# Patient Record
Sex: Female | Born: 1950 | Hispanic: No | Marital: Married
Health system: Southern US, Community
[De-identification: ages and names within clinical notes are randomized; demographics above are authoritative.]

---

## 2010-08-21 ENCOUNTER — Encounter: Admission: RE | Admit: 2010-08-21 | Discharge: 2010-08-21 | Payer: Self-pay | Admitting: Unknown Physician Specialty

## 2012-03-26 ENCOUNTER — Other Ambulatory Visit: Payer: Self-pay | Admitting: Family Medicine

## 2012-03-26 ENCOUNTER — Ambulatory Visit
Admission: RE | Admit: 2012-03-26 | Discharge: 2012-03-26 | Disposition: A | Discharge status: Home / Self Care | Payer: No Typology Code available for payment source | Source: Ambulatory Visit | Attending: Family Medicine | Admitting: Family Medicine

## 2012-03-26 DIAGNOSIS — R05 Cough: Secondary | ICD-10-CM

## 2012-03-26 DIAGNOSIS — R053 Chronic cough: Secondary | ICD-10-CM

## 2013-05-15 IMAGING — CR DG CHEST 2V
2 series · 2 of 2 positions shown · non-contrast
Comparison: None.

CLINICAL DATA: Chronic cough for 9 months, former smoking history

CHEST - 2 VIEW

[view not recorded (1 of 2)]
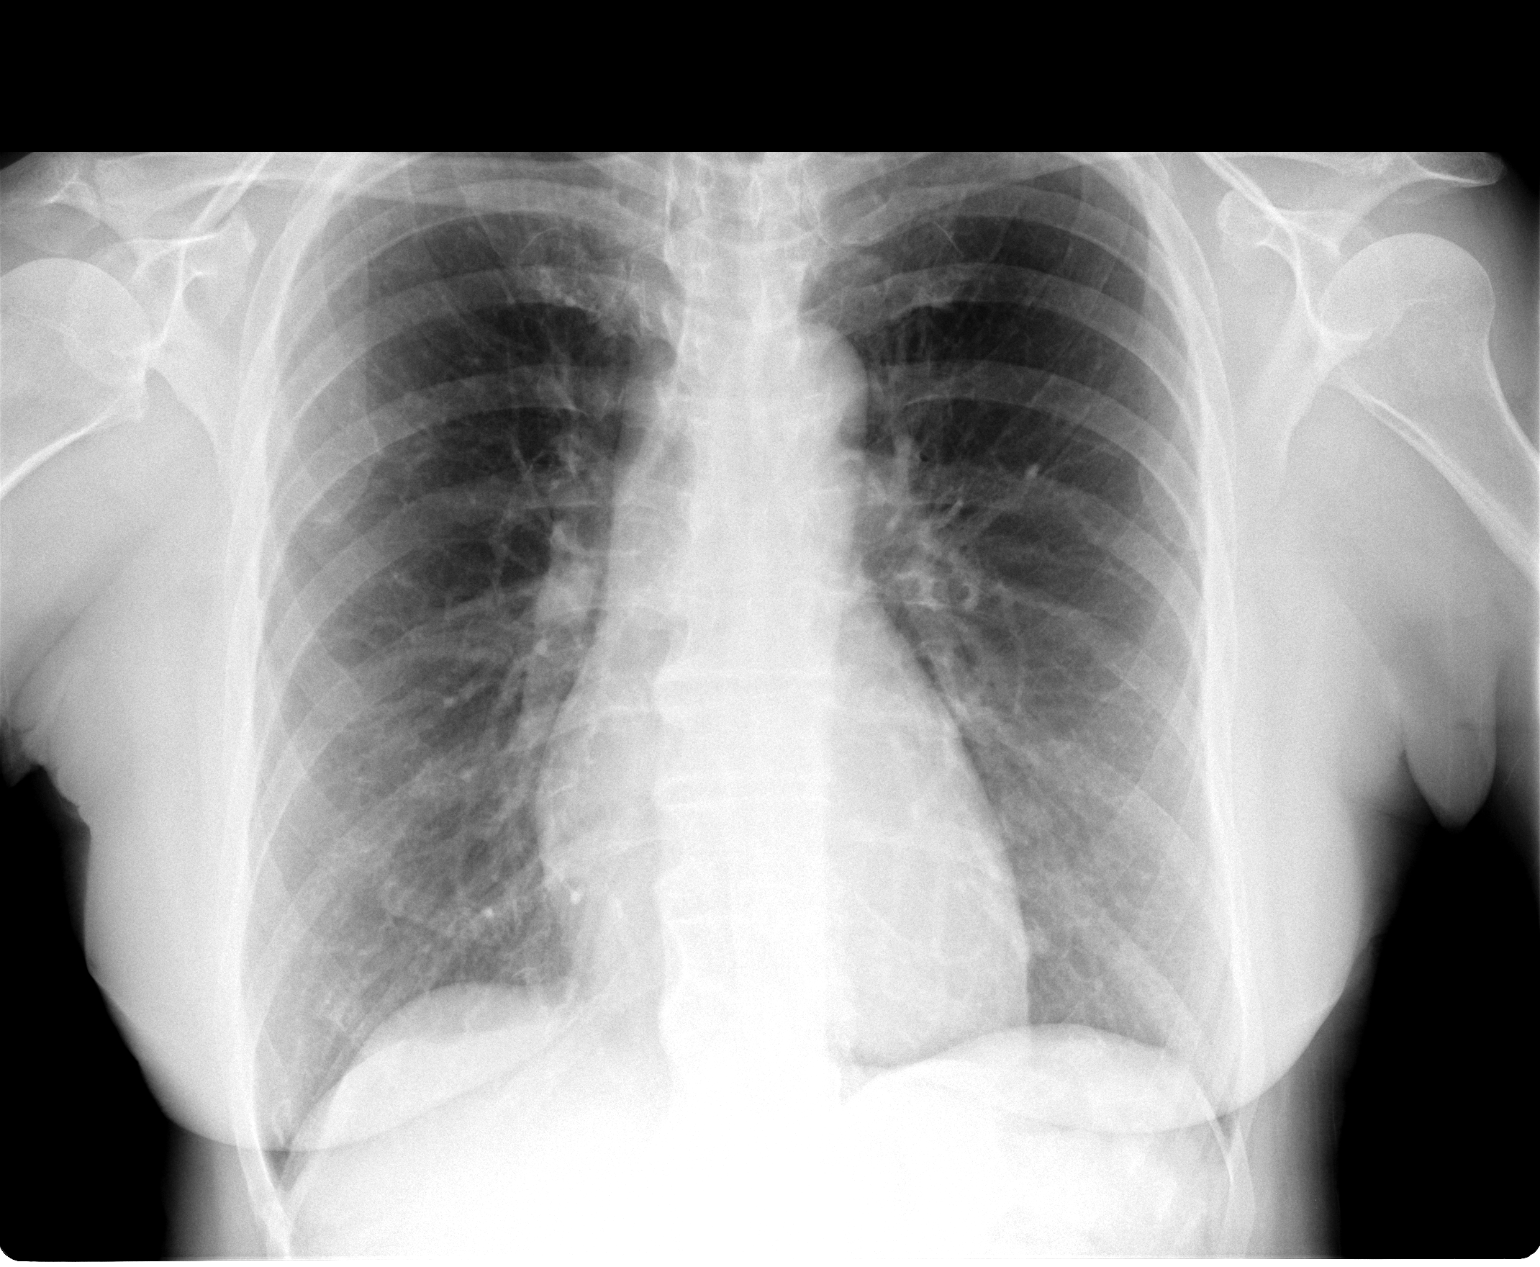

[view not recorded (2 of 2)]
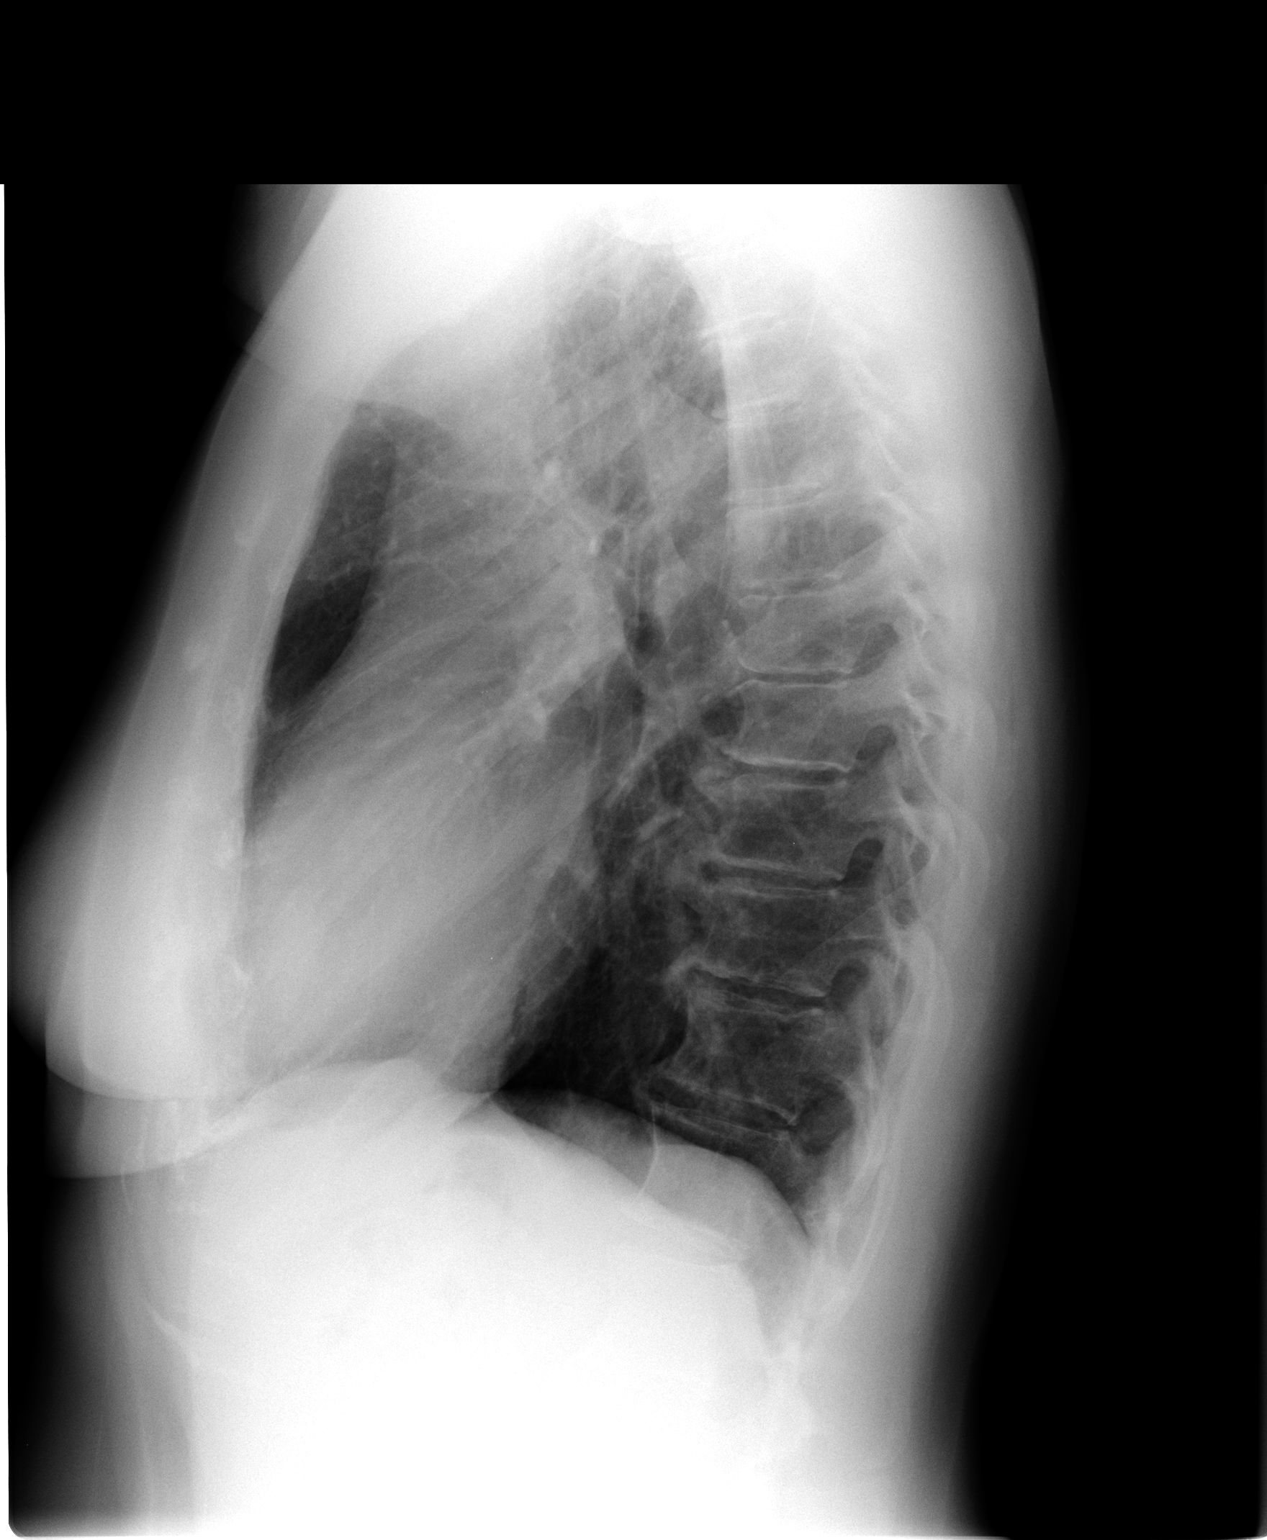

[2 of 2 positions shown; findings below may reference images not displayed]

FINDINGS: The lungs are clear and slightly hyperaerated.
Mediastinal contours appear normal.  The heart is within normal
limits in size.  There are degenerative changes throughout the
thoracic spine.
IMPRESSION: No active lung disease.

## 2013-11-11 DIAGNOSIS — Z23 Encounter for immunization: Secondary | ICD-10-CM | POA: Diagnosis not present

## 2013-12-09 DIAGNOSIS — H4011X0 Primary open-angle glaucoma, stage unspecified: Secondary | ICD-10-CM | POA: Diagnosis not present

## 2013-12-09 DIAGNOSIS — H409 Unspecified glaucoma: Secondary | ICD-10-CM | POA: Diagnosis not present

## 2014-02-02 DIAGNOSIS — Z733 Stress, not elsewhere classified: Secondary | ICD-10-CM | POA: Diagnosis not present

## 2014-02-02 DIAGNOSIS — G4730 Sleep apnea, unspecified: Secondary | ICD-10-CM | POA: Diagnosis not present

## 2014-02-02 DIAGNOSIS — H6120 Impacted cerumen, unspecified ear: Secondary | ICD-10-CM | POA: Diagnosis not present

## 2014-02-02 DIAGNOSIS — E7800 Pure hypercholesterolemia, unspecified: Secondary | ICD-10-CM | POA: Diagnosis not present

## 2014-02-02 DIAGNOSIS — J309 Allergic rhinitis, unspecified: Secondary | ICD-10-CM | POA: Diagnosis not present

## 2014-02-02 DIAGNOSIS — J069 Acute upper respiratory infection, unspecified: Secondary | ICD-10-CM | POA: Diagnosis not present

## 2014-02-02 DIAGNOSIS — N943 Premenstrual tension syndrome: Secondary | ICD-10-CM | POA: Diagnosis not present

## 2014-02-02 DIAGNOSIS — R05 Cough: Secondary | ICD-10-CM | POA: Diagnosis not present

## 2014-02-02 DIAGNOSIS — R059 Cough, unspecified: Secondary | ICD-10-CM | POA: Diagnosis not present

## 2014-02-02 DIAGNOSIS — J9801 Acute bronchospasm: Secondary | ICD-10-CM | POA: Diagnosis not present

## 2014-02-26 DIAGNOSIS — M26629 Arthralgia of temporomandibular joint, unspecified side: Secondary | ICD-10-CM | POA: Diagnosis not present

## 2014-02-26 DIAGNOSIS — R6884 Jaw pain: Secondary | ICD-10-CM | POA: Diagnosis not present

## 2014-02-26 DIAGNOSIS — Z0000 Encounter for general adult medical examination without abnormal findings: Secondary | ICD-10-CM | POA: Insufficient documentation

## 2014-03-10 DIAGNOSIS — H40029 Open angle with borderline findings, high risk, unspecified eye: Secondary | ICD-10-CM | POA: Insufficient documentation

## 2014-03-10 DIAGNOSIS — H2510 Age-related nuclear cataract, unspecified eye: Secondary | ICD-10-CM | POA: Diagnosis not present

## 2014-03-10 DIAGNOSIS — H2513 Age-related nuclear cataract, bilateral: Secondary | ICD-10-CM | POA: Insufficient documentation

## 2014-03-17 DIAGNOSIS — J309 Allergic rhinitis, unspecified: Secondary | ICD-10-CM | POA: Diagnosis not present

## 2014-03-17 DIAGNOSIS — E7800 Pure hypercholesterolemia, unspecified: Secondary | ICD-10-CM | POA: Diagnosis not present

## 2014-03-17 DIAGNOSIS — S0300XA Dislocation of jaw, unspecified side, initial encounter: Secondary | ICD-10-CM | POA: Diagnosis not present

## 2014-03-17 DIAGNOSIS — Z733 Stress, not elsewhere classified: Secondary | ICD-10-CM | POA: Diagnosis not present

## 2014-03-17 DIAGNOSIS — G4730 Sleep apnea, unspecified: Secondary | ICD-10-CM | POA: Diagnosis not present

## 2014-03-17 DIAGNOSIS — H409 Unspecified glaucoma: Secondary | ICD-10-CM | POA: Diagnosis not present

## 2014-03-17 DIAGNOSIS — N943 Premenstrual tension syndrome: Secondary | ICD-10-CM | POA: Diagnosis not present

## 2014-03-17 DIAGNOSIS — J45909 Unspecified asthma, uncomplicated: Secondary | ICD-10-CM | POA: Diagnosis not present

## 2014-04-27 DIAGNOSIS — H2510 Age-related nuclear cataract, unspecified eye: Secondary | ICD-10-CM | POA: Diagnosis not present

## 2014-04-27 DIAGNOSIS — H40029 Open angle with borderline findings, high risk, unspecified eye: Secondary | ICD-10-CM | POA: Diagnosis not present

## 2014-05-13 DIAGNOSIS — H40029 Open angle with borderline findings, high risk, unspecified eye: Secondary | ICD-10-CM | POA: Diagnosis not present

## 2014-06-22 DIAGNOSIS — H2510 Age-related nuclear cataract, unspecified eye: Secondary | ICD-10-CM | POA: Diagnosis not present

## 2014-06-22 DIAGNOSIS — H40029 Open angle with borderline findings, high risk, unspecified eye: Secondary | ICD-10-CM | POA: Diagnosis not present

## 2014-06-22 DIAGNOSIS — H10019 Acute follicular conjunctivitis, unspecified eye: Secondary | ICD-10-CM | POA: Diagnosis not present

## 2014-07-20 DIAGNOSIS — Z23 Encounter for immunization: Secondary | ICD-10-CM | POA: Diagnosis not present

## 2014-07-26 DIAGNOSIS — M7742 Metatarsalgia, left foot: Secondary | ICD-10-CM | POA: Diagnosis not present

## 2014-07-26 DIAGNOSIS — M71571 Other bursitis, not elsewhere classified, right ankle and foot: Secondary | ICD-10-CM | POA: Diagnosis not present

## 2014-07-26 DIAGNOSIS — M7731 Calcaneal spur, right foot: Secondary | ICD-10-CM | POA: Diagnosis not present

## 2014-07-26 DIAGNOSIS — M7752 Other enthesopathy of left foot: Secondary | ICD-10-CM | POA: Diagnosis not present

## 2014-07-26 DIAGNOSIS — M2042 Other hammer toe(s) (acquired), left foot: Secondary | ICD-10-CM | POA: Diagnosis not present

## 2014-07-26 DIAGNOSIS — M2012 Hallux valgus (acquired), left foot: Secondary | ICD-10-CM | POA: Diagnosis not present

## 2014-07-26 DIAGNOSIS — M7661 Achilles tendinitis, right leg: Secondary | ICD-10-CM | POA: Diagnosis not present

## 2014-08-02 DIAGNOSIS — H2513 Age-related nuclear cataract, bilateral: Secondary | ICD-10-CM | POA: Diagnosis not present

## 2014-08-02 DIAGNOSIS — H40023 Open angle with borderline findings, high risk, bilateral: Secondary | ICD-10-CM | POA: Diagnosis not present

## 2014-09-01 DIAGNOSIS — M7731 Calcaneal spur, right foot: Secondary | ICD-10-CM | POA: Diagnosis not present

## 2014-09-01 DIAGNOSIS — M7661 Achilles tendinitis, right leg: Secondary | ICD-10-CM | POA: Diagnosis not present

## 2014-10-25 DIAGNOSIS — J069 Acute upper respiratory infection, unspecified: Secondary | ICD-10-CM | POA: Diagnosis not present

## 2014-10-25 DIAGNOSIS — J9801 Acute bronchospasm: Secondary | ICD-10-CM | POA: Diagnosis not present

## 2014-10-29 DIAGNOSIS — R070 Pain in throat: Secondary | ICD-10-CM | POA: Diagnosis not present

## 2014-11-02 DIAGNOSIS — G4730 Sleep apnea, unspecified: Secondary | ICD-10-CM | POA: Diagnosis not present

## 2014-11-02 DIAGNOSIS — E780 Pure hypercholesterolemia: Secondary | ICD-10-CM | POA: Diagnosis not present

## 2014-11-02 DIAGNOSIS — Z0000 Encounter for general adult medical examination without abnormal findings: Secondary | ICD-10-CM | POA: Diagnosis not present

## 2014-11-02 DIAGNOSIS — N943 Premenstrual tension syndrome: Secondary | ICD-10-CM | POA: Diagnosis not present

## 2014-11-02 DIAGNOSIS — J45909 Unspecified asthma, uncomplicated: Secondary | ICD-10-CM | POA: Diagnosis not present

## 2014-11-02 DIAGNOSIS — Z23 Encounter for immunization: Secondary | ICD-10-CM | POA: Diagnosis not present

## 2014-11-09 DIAGNOSIS — E780 Pure hypercholesterolemia: Secondary | ICD-10-CM | POA: Diagnosis not present

## 2014-11-09 DIAGNOSIS — Z0000 Encounter for general adult medical examination without abnormal findings: Secondary | ICD-10-CM | POA: Diagnosis not present

## 2014-11-10 DIAGNOSIS — G4733 Obstructive sleep apnea (adult) (pediatric): Secondary | ICD-10-CM | POA: Diagnosis not present

## 2014-11-12 DIAGNOSIS — Z79899 Other long term (current) drug therapy: Secondary | ICD-10-CM | POA: Diagnosis not present

## 2014-11-12 DIAGNOSIS — Z1211 Encounter for screening for malignant neoplasm of colon: Secondary | ICD-10-CM | POA: Diagnosis not present

## 2014-11-17 DIAGNOSIS — H4011X1 Primary open-angle glaucoma, mild stage: Secondary | ICD-10-CM | POA: Diagnosis not present

## 2014-12-13 DIAGNOSIS — R0789 Other chest pain: Secondary | ICD-10-CM | POA: Diagnosis not present

## 2014-12-13 DIAGNOSIS — J9801 Acute bronchospasm: Secondary | ICD-10-CM | POA: Diagnosis not present

## 2014-12-13 DIAGNOSIS — J069 Acute upper respiratory infection, unspecified: Secondary | ICD-10-CM | POA: Diagnosis not present

## 2014-12-13 DIAGNOSIS — R0602 Shortness of breath: Secondary | ICD-10-CM | POA: Diagnosis not present

## 2014-12-13 DIAGNOSIS — J45909 Unspecified asthma, uncomplicated: Secondary | ICD-10-CM | POA: Diagnosis not present

## 2014-12-15 DIAGNOSIS — D2339 Other benign neoplasm of skin of other parts of face: Secondary | ICD-10-CM | POA: Diagnosis not present

## 2014-12-15 DIAGNOSIS — D485 Neoplasm of uncertain behavior of skin: Secondary | ICD-10-CM | POA: Diagnosis not present

## 2014-12-15 DIAGNOSIS — L820 Inflamed seborrheic keratosis: Secondary | ICD-10-CM | POA: Diagnosis not present

## 2015-01-04 DIAGNOSIS — G4733 Obstructive sleep apnea (adult) (pediatric): Secondary | ICD-10-CM | POA: Diagnosis not present

## 2015-01-10 DIAGNOSIS — G4733 Obstructive sleep apnea (adult) (pediatric): Secondary | ICD-10-CM | POA: Diagnosis not present

## 2015-02-09 DIAGNOSIS — R52 Pain, unspecified: Secondary | ICD-10-CM | POA: Diagnosis not present

## 2015-02-09 DIAGNOSIS — J309 Allergic rhinitis, unspecified: Secondary | ICD-10-CM | POA: Diagnosis not present

## 2015-02-09 DIAGNOSIS — J029 Acute pharyngitis, unspecified: Secondary | ICD-10-CM | POA: Diagnosis not present

## 2015-02-23 DIAGNOSIS — G4733 Obstructive sleep apnea (adult) (pediatric): Secondary | ICD-10-CM | POA: Diagnosis not present

## 2015-03-03 DIAGNOSIS — G4733 Obstructive sleep apnea (adult) (pediatric): Secondary | ICD-10-CM | POA: Diagnosis not present

## 2015-05-18 DIAGNOSIS — H4011X1 Primary open-angle glaucoma, mild stage: Secondary | ICD-10-CM | POA: Diagnosis not present

## 2015-05-23 DIAGNOSIS — F439 Reaction to severe stress, unspecified: Secondary | ICD-10-CM | POA: Diagnosis not present

## 2015-05-23 DIAGNOSIS — M545 Low back pain: Secondary | ICD-10-CM | POA: Diagnosis not present

## 2015-05-23 DIAGNOSIS — J45909 Unspecified asthma, uncomplicated: Secondary | ICD-10-CM | POA: Diagnosis not present

## 2015-05-23 DIAGNOSIS — E780 Pure hypercholesterolemia: Secondary | ICD-10-CM | POA: Diagnosis not present

## 2015-06-01 DIAGNOSIS — M531 Cervicobrachial syndrome: Secondary | ICD-10-CM | POA: Diagnosis not present

## 2015-06-01 DIAGNOSIS — M9903 Segmental and somatic dysfunction of lumbar region: Secondary | ICD-10-CM | POA: Diagnosis not present

## 2015-06-01 DIAGNOSIS — M9904 Segmental and somatic dysfunction of sacral region: Secondary | ICD-10-CM | POA: Diagnosis not present

## 2015-06-01 DIAGNOSIS — M9901 Segmental and somatic dysfunction of cervical region: Secondary | ICD-10-CM | POA: Diagnosis not present

## 2015-06-02 DIAGNOSIS — M9901 Segmental and somatic dysfunction of cervical region: Secondary | ICD-10-CM | POA: Diagnosis not present

## 2015-06-02 DIAGNOSIS — M531 Cervicobrachial syndrome: Secondary | ICD-10-CM | POA: Diagnosis not present

## 2015-06-02 DIAGNOSIS — M9904 Segmental and somatic dysfunction of sacral region: Secondary | ICD-10-CM | POA: Diagnosis not present

## 2015-06-02 DIAGNOSIS — M9903 Segmental and somatic dysfunction of lumbar region: Secondary | ICD-10-CM | POA: Diagnosis not present

## 2015-06-06 DIAGNOSIS — M9904 Segmental and somatic dysfunction of sacral region: Secondary | ICD-10-CM | POA: Diagnosis not present

## 2015-06-06 DIAGNOSIS — M531 Cervicobrachial syndrome: Secondary | ICD-10-CM | POA: Diagnosis not present

## 2015-06-06 DIAGNOSIS — M9903 Segmental and somatic dysfunction of lumbar region: Secondary | ICD-10-CM | POA: Diagnosis not present

## 2015-06-06 DIAGNOSIS — M9901 Segmental and somatic dysfunction of cervical region: Secondary | ICD-10-CM | POA: Diagnosis not present

## 2015-06-07 DIAGNOSIS — M9901 Segmental and somatic dysfunction of cervical region: Secondary | ICD-10-CM | POA: Diagnosis not present

## 2015-06-07 DIAGNOSIS — M531 Cervicobrachial syndrome: Secondary | ICD-10-CM | POA: Diagnosis not present

## 2015-06-07 DIAGNOSIS — M9903 Segmental and somatic dysfunction of lumbar region: Secondary | ICD-10-CM | POA: Diagnosis not present

## 2015-06-07 DIAGNOSIS — M9904 Segmental and somatic dysfunction of sacral region: Secondary | ICD-10-CM | POA: Diagnosis not present

## 2015-06-08 DIAGNOSIS — M9904 Segmental and somatic dysfunction of sacral region: Secondary | ICD-10-CM | POA: Diagnosis not present

## 2015-06-08 DIAGNOSIS — M531 Cervicobrachial syndrome: Secondary | ICD-10-CM | POA: Diagnosis not present

## 2015-06-08 DIAGNOSIS — M9903 Segmental and somatic dysfunction of lumbar region: Secondary | ICD-10-CM | POA: Diagnosis not present

## 2015-06-08 DIAGNOSIS — M9901 Segmental and somatic dysfunction of cervical region: Secondary | ICD-10-CM | POA: Diagnosis not present

## 2015-06-09 DIAGNOSIS — M9904 Segmental and somatic dysfunction of sacral region: Secondary | ICD-10-CM | POA: Diagnosis not present

## 2015-06-09 DIAGNOSIS — M531 Cervicobrachial syndrome: Secondary | ICD-10-CM | POA: Diagnosis not present

## 2015-06-09 DIAGNOSIS — M9903 Segmental and somatic dysfunction of lumbar region: Secondary | ICD-10-CM | POA: Diagnosis not present

## 2015-06-09 DIAGNOSIS — M9901 Segmental and somatic dysfunction of cervical region: Secondary | ICD-10-CM | POA: Diagnosis not present

## 2015-06-13 DIAGNOSIS — M531 Cervicobrachial syndrome: Secondary | ICD-10-CM | POA: Diagnosis not present

## 2015-06-13 DIAGNOSIS — M9904 Segmental and somatic dysfunction of sacral region: Secondary | ICD-10-CM | POA: Diagnosis not present

## 2015-06-13 DIAGNOSIS — M9903 Segmental and somatic dysfunction of lumbar region: Secondary | ICD-10-CM | POA: Diagnosis not present

## 2015-06-13 DIAGNOSIS — M9901 Segmental and somatic dysfunction of cervical region: Secondary | ICD-10-CM | POA: Diagnosis not present

## 2015-06-14 DIAGNOSIS — M531 Cervicobrachial syndrome: Secondary | ICD-10-CM | POA: Diagnosis not present

## 2015-06-14 DIAGNOSIS — M9903 Segmental and somatic dysfunction of lumbar region: Secondary | ICD-10-CM | POA: Diagnosis not present

## 2015-06-14 DIAGNOSIS — M9901 Segmental and somatic dysfunction of cervical region: Secondary | ICD-10-CM | POA: Diagnosis not present

## 2015-06-14 DIAGNOSIS — M9904 Segmental and somatic dysfunction of sacral region: Secondary | ICD-10-CM | POA: Diagnosis not present

## 2015-06-15 DIAGNOSIS — M531 Cervicobrachial syndrome: Secondary | ICD-10-CM | POA: Diagnosis not present

## 2015-06-15 DIAGNOSIS — M9903 Segmental and somatic dysfunction of lumbar region: Secondary | ICD-10-CM | POA: Diagnosis not present

## 2015-06-15 DIAGNOSIS — M9901 Segmental and somatic dysfunction of cervical region: Secondary | ICD-10-CM | POA: Diagnosis not present

## 2015-06-15 DIAGNOSIS — M9904 Segmental and somatic dysfunction of sacral region: Secondary | ICD-10-CM | POA: Diagnosis not present

## 2015-06-16 DIAGNOSIS — M9901 Segmental and somatic dysfunction of cervical region: Secondary | ICD-10-CM | POA: Diagnosis not present

## 2015-06-16 DIAGNOSIS — M531 Cervicobrachial syndrome: Secondary | ICD-10-CM | POA: Diagnosis not present

## 2015-06-16 DIAGNOSIS — M9903 Segmental and somatic dysfunction of lumbar region: Secondary | ICD-10-CM | POA: Diagnosis not present

## 2015-06-16 DIAGNOSIS — M9904 Segmental and somatic dysfunction of sacral region: Secondary | ICD-10-CM | POA: Diagnosis not present

## 2015-06-21 DIAGNOSIS — M9904 Segmental and somatic dysfunction of sacral region: Secondary | ICD-10-CM | POA: Diagnosis not present

## 2015-06-21 DIAGNOSIS — M531 Cervicobrachial syndrome: Secondary | ICD-10-CM | POA: Diagnosis not present

## 2015-06-21 DIAGNOSIS — M9901 Segmental and somatic dysfunction of cervical region: Secondary | ICD-10-CM | POA: Diagnosis not present

## 2015-06-21 DIAGNOSIS — M9903 Segmental and somatic dysfunction of lumbar region: Secondary | ICD-10-CM | POA: Diagnosis not present

## 2015-06-21 DIAGNOSIS — Z23 Encounter for immunization: Secondary | ICD-10-CM | POA: Diagnosis not present

## 2015-06-22 DIAGNOSIS — M531 Cervicobrachial syndrome: Secondary | ICD-10-CM | POA: Diagnosis not present

## 2015-06-22 DIAGNOSIS — M9904 Segmental and somatic dysfunction of sacral region: Secondary | ICD-10-CM | POA: Diagnosis not present

## 2015-06-22 DIAGNOSIS — M9903 Segmental and somatic dysfunction of lumbar region: Secondary | ICD-10-CM | POA: Diagnosis not present

## 2015-06-22 DIAGNOSIS — M9901 Segmental and somatic dysfunction of cervical region: Secondary | ICD-10-CM | POA: Diagnosis not present

## 2015-06-30 DIAGNOSIS — H01002 Unspecified blepharitis right lower eyelid: Secondary | ICD-10-CM | POA: Diagnosis not present

## 2015-06-30 DIAGNOSIS — H01004 Unspecified blepharitis left upper eyelid: Secondary | ICD-10-CM | POA: Diagnosis not present

## 2015-06-30 DIAGNOSIS — H01005 Unspecified blepharitis left lower eyelid: Secondary | ICD-10-CM | POA: Diagnosis not present

## 2015-06-30 DIAGNOSIS — H01001 Unspecified blepharitis right upper eyelid: Secondary | ICD-10-CM | POA: Diagnosis not present

## 2015-07-13 DIAGNOSIS — M9904 Segmental and somatic dysfunction of sacral region: Secondary | ICD-10-CM | POA: Diagnosis not present

## 2015-07-13 DIAGNOSIS — M9903 Segmental and somatic dysfunction of lumbar region: Secondary | ICD-10-CM | POA: Diagnosis not present

## 2015-07-13 DIAGNOSIS — M9901 Segmental and somatic dysfunction of cervical region: Secondary | ICD-10-CM | POA: Diagnosis not present

## 2015-07-13 DIAGNOSIS — M531 Cervicobrachial syndrome: Secondary | ICD-10-CM | POA: Diagnosis not present

## 2015-07-14 DIAGNOSIS — M531 Cervicobrachial syndrome: Secondary | ICD-10-CM | POA: Diagnosis not present

## 2015-07-14 DIAGNOSIS — M9901 Segmental and somatic dysfunction of cervical region: Secondary | ICD-10-CM | POA: Diagnosis not present

## 2015-07-14 DIAGNOSIS — M9903 Segmental and somatic dysfunction of lumbar region: Secondary | ICD-10-CM | POA: Diagnosis not present

## 2015-07-14 DIAGNOSIS — M9904 Segmental and somatic dysfunction of sacral region: Secondary | ICD-10-CM | POA: Diagnosis not present

## 2015-07-18 DIAGNOSIS — M531 Cervicobrachial syndrome: Secondary | ICD-10-CM | POA: Diagnosis not present

## 2015-07-18 DIAGNOSIS — M9904 Segmental and somatic dysfunction of sacral region: Secondary | ICD-10-CM | POA: Diagnosis not present

## 2015-07-18 DIAGNOSIS — M9901 Segmental and somatic dysfunction of cervical region: Secondary | ICD-10-CM | POA: Diagnosis not present

## 2015-07-18 DIAGNOSIS — M9903 Segmental and somatic dysfunction of lumbar region: Secondary | ICD-10-CM | POA: Diagnosis not present

## 2015-07-19 DIAGNOSIS — M531 Cervicobrachial syndrome: Secondary | ICD-10-CM | POA: Diagnosis not present

## 2015-07-19 DIAGNOSIS — M9901 Segmental and somatic dysfunction of cervical region: Secondary | ICD-10-CM | POA: Diagnosis not present

## 2015-07-19 DIAGNOSIS — M9904 Segmental and somatic dysfunction of sacral region: Secondary | ICD-10-CM | POA: Diagnosis not present

## 2015-07-19 DIAGNOSIS — M9903 Segmental and somatic dysfunction of lumbar region: Secondary | ICD-10-CM | POA: Diagnosis not present

## 2015-07-21 DIAGNOSIS — M9903 Segmental and somatic dysfunction of lumbar region: Secondary | ICD-10-CM | POA: Diagnosis not present

## 2015-07-21 DIAGNOSIS — M531 Cervicobrachial syndrome: Secondary | ICD-10-CM | POA: Diagnosis not present

## 2015-07-21 DIAGNOSIS — M9901 Segmental and somatic dysfunction of cervical region: Secondary | ICD-10-CM | POA: Diagnosis not present

## 2015-07-21 DIAGNOSIS — M9904 Segmental and somatic dysfunction of sacral region: Secondary | ICD-10-CM | POA: Diagnosis not present

## 2015-07-25 DIAGNOSIS — M9904 Segmental and somatic dysfunction of sacral region: Secondary | ICD-10-CM | POA: Diagnosis not present

## 2015-07-25 DIAGNOSIS — M9903 Segmental and somatic dysfunction of lumbar region: Secondary | ICD-10-CM | POA: Diagnosis not present

## 2015-07-25 DIAGNOSIS — M9901 Segmental and somatic dysfunction of cervical region: Secondary | ICD-10-CM | POA: Diagnosis not present

## 2015-07-25 DIAGNOSIS — M531 Cervicobrachial syndrome: Secondary | ICD-10-CM | POA: Diagnosis not present

## 2015-07-27 DIAGNOSIS — M9904 Segmental and somatic dysfunction of sacral region: Secondary | ICD-10-CM | POA: Diagnosis not present

## 2015-07-27 DIAGNOSIS — M9901 Segmental and somatic dysfunction of cervical region: Secondary | ICD-10-CM | POA: Diagnosis not present

## 2015-07-27 DIAGNOSIS — M531 Cervicobrachial syndrome: Secondary | ICD-10-CM | POA: Diagnosis not present

## 2015-07-27 DIAGNOSIS — M9903 Segmental and somatic dysfunction of lumbar region: Secondary | ICD-10-CM | POA: Diagnosis not present

## 2015-08-24 DIAGNOSIS — M5136 Other intervertebral disc degeneration, lumbar region: Secondary | ICD-10-CM | POA: Diagnosis not present

## 2015-08-24 DIAGNOSIS — M5416 Radiculopathy, lumbar region: Secondary | ICD-10-CM | POA: Diagnosis not present

## 2015-08-24 DIAGNOSIS — M25519 Pain in unspecified shoulder: Secondary | ICD-10-CM | POA: Diagnosis not present

## 2015-08-24 DIAGNOSIS — Z79899 Other long term (current) drug therapy: Secondary | ICD-10-CM | POA: Diagnosis not present

## 2015-08-24 DIAGNOSIS — G8929 Other chronic pain: Secondary | ICD-10-CM | POA: Diagnosis not present

## 2015-08-24 DIAGNOSIS — Z79891 Long term (current) use of opiate analgesic: Secondary | ICD-10-CM | POA: Diagnosis not present

## 2015-09-06 DIAGNOSIS — M5136 Other intervertebral disc degeneration, lumbar region: Secondary | ICD-10-CM | POA: Diagnosis not present

## 2015-09-06 DIAGNOSIS — M47817 Spondylosis without myelopathy or radiculopathy, lumbosacral region: Secondary | ICD-10-CM | POA: Diagnosis not present

## 2015-09-06 DIAGNOSIS — M47816 Spondylosis without myelopathy or radiculopathy, lumbar region: Secondary | ICD-10-CM | POA: Diagnosis not present

## 2015-09-06 DIAGNOSIS — M4806 Spinal stenosis, lumbar region: Secondary | ICD-10-CM | POA: Diagnosis not present

## 2015-11-02 DIAGNOSIS — Z79891 Long term (current) use of opiate analgesic: Secondary | ICD-10-CM | POA: Diagnosis not present

## 2015-11-03 DIAGNOSIS — F419 Anxiety disorder, unspecified: Secondary | ICD-10-CM | POA: Diagnosis not present

## 2015-11-03 DIAGNOSIS — M47816 Spondylosis without myelopathy or radiculopathy, lumbar region: Secondary | ICD-10-CM | POA: Diagnosis not present

## 2015-11-03 DIAGNOSIS — M545 Low back pain: Secondary | ICD-10-CM | POA: Diagnosis not present

## 2015-11-03 DIAGNOSIS — M5416 Radiculopathy, lumbar region: Secondary | ICD-10-CM | POA: Diagnosis not present

## 2015-11-03 DIAGNOSIS — G8929 Other chronic pain: Secondary | ICD-10-CM | POA: Diagnosis not present

## 2015-11-03 DIAGNOSIS — Z79891 Long term (current) use of opiate analgesic: Secondary | ICD-10-CM | POA: Diagnosis not present

## 2015-11-08 DIAGNOSIS — H15122 Nodular episcleritis, left eye: Secondary | ICD-10-CM | POA: Diagnosis not present

## 2015-11-21 DIAGNOSIS — H00029 Hordeolum internum unspecified eye, unspecified eyelid: Secondary | ICD-10-CM | POA: Diagnosis not present

## 2015-11-22 DIAGNOSIS — K4090 Unilateral inguinal hernia, without obstruction or gangrene, not specified as recurrent: Secondary | ICD-10-CM | POA: Diagnosis not present

## 2015-11-22 DIAGNOSIS — Z01419 Encounter for gynecological examination (general) (routine) without abnormal findings: Secondary | ICD-10-CM | POA: Diagnosis not present

## 2015-11-22 DIAGNOSIS — F439 Reaction to severe stress, unspecified: Secondary | ICD-10-CM | POA: Diagnosis not present

## 2015-11-22 DIAGNOSIS — Z23 Encounter for immunization: Secondary | ICD-10-CM | POA: Diagnosis not present

## 2015-11-22 DIAGNOSIS — Z1289 Encounter for screening for malignant neoplasm of other sites: Secondary | ICD-10-CM | POA: Diagnosis not present

## 2015-11-22 DIAGNOSIS — J45909 Unspecified asthma, uncomplicated: Secondary | ICD-10-CM | POA: Diagnosis not present

## 2015-11-22 DIAGNOSIS — M545 Low back pain: Secondary | ICD-10-CM | POA: Diagnosis not present

## 2015-11-22 DIAGNOSIS — E7800 Pure hypercholesterolemia, unspecified: Secondary | ICD-10-CM | POA: Diagnosis not present

## 2015-11-22 DIAGNOSIS — Z0001 Encounter for general adult medical examination with abnormal findings: Secondary | ICD-10-CM | POA: Diagnosis not present

## 2015-11-22 DIAGNOSIS — H409 Unspecified glaucoma: Secondary | ICD-10-CM | POA: Diagnosis not present

## 2015-11-22 DIAGNOSIS — I10 Essential (primary) hypertension: Secondary | ICD-10-CM | POA: Diagnosis not present

## 2015-12-01 DIAGNOSIS — H0019 Chalazion unspecified eye, unspecified eyelid: Secondary | ICD-10-CM | POA: Diagnosis not present

## 2015-12-01 DIAGNOSIS — H018 Other specified inflammations of eyelid: Secondary | ICD-10-CM | POA: Diagnosis not present

## 2015-12-02 DIAGNOSIS — Z1211 Encounter for screening for malignant neoplasm of colon: Secondary | ICD-10-CM | POA: Diagnosis not present

## 2015-12-02 DIAGNOSIS — Z79899 Other long term (current) drug therapy: Secondary | ICD-10-CM | POA: Diagnosis not present

## 2015-12-08 DIAGNOSIS — M5416 Radiculopathy, lumbar region: Secondary | ICD-10-CM | POA: Diagnosis not present

## 2015-12-08 DIAGNOSIS — G8929 Other chronic pain: Secondary | ICD-10-CM | POA: Diagnosis not present

## 2015-12-08 DIAGNOSIS — M47816 Spondylosis without myelopathy or radiculopathy, lumbar region: Secondary | ICD-10-CM | POA: Diagnosis not present

## 2015-12-14 DIAGNOSIS — J9801 Acute bronchospasm: Secondary | ICD-10-CM | POA: Diagnosis not present

## 2015-12-14 DIAGNOSIS — J069 Acute upper respiratory infection, unspecified: Secondary | ICD-10-CM | POA: Diagnosis not present

## 2015-12-14 DIAGNOSIS — R0789 Other chest pain: Secondary | ICD-10-CM | POA: Diagnosis not present

## 2015-12-14 DIAGNOSIS — I499 Cardiac arrhythmia, unspecified: Secondary | ICD-10-CM | POA: Diagnosis not present

## 2015-12-16 DIAGNOSIS — Z780 Asymptomatic menopausal state: Secondary | ICD-10-CM | POA: Diagnosis not present

## 2015-12-16 DIAGNOSIS — Z1231 Encounter for screening mammogram for malignant neoplasm of breast: Secondary | ICD-10-CM | POA: Diagnosis not present

## 2016-01-05 DIAGNOSIS — Z79899 Other long term (current) drug therapy: Secondary | ICD-10-CM | POA: Diagnosis not present

## 2016-01-05 DIAGNOSIS — G8929 Other chronic pain: Secondary | ICD-10-CM | POA: Diagnosis not present

## 2016-01-05 DIAGNOSIS — M47816 Spondylosis without myelopathy or radiculopathy, lumbar region: Secondary | ICD-10-CM | POA: Diagnosis not present

## 2016-01-05 DIAGNOSIS — Z1211 Encounter for screening for malignant neoplasm of colon: Secondary | ICD-10-CM | POA: Diagnosis not present

## 2016-02-09 DIAGNOSIS — G8929 Other chronic pain: Secondary | ICD-10-CM | POA: Diagnosis not present

## 2016-02-09 DIAGNOSIS — M47816 Spondylosis without myelopathy or radiculopathy, lumbar region: Secondary | ICD-10-CM | POA: Diagnosis not present

## 2016-02-21 DIAGNOSIS — K219 Gastro-esophageal reflux disease without esophagitis: Secondary | ICD-10-CM | POA: Insufficient documentation

## 2016-02-23 DIAGNOSIS — F39 Unspecified mood [affective] disorder: Secondary | ICD-10-CM | POA: Diagnosis not present

## 2016-02-23 DIAGNOSIS — Z9989 Dependence on other enabling machines and devices: Secondary | ICD-10-CM | POA: Diagnosis not present

## 2016-02-23 DIAGNOSIS — G4733 Obstructive sleep apnea (adult) (pediatric): Secondary | ICD-10-CM | POA: Diagnosis not present

## 2016-02-23 DIAGNOSIS — K219 Gastro-esophageal reflux disease without esophagitis: Secondary | ICD-10-CM | POA: Diagnosis not present

## 2016-05-30 DIAGNOSIS — J45909 Unspecified asthma, uncomplicated: Secondary | ICD-10-CM | POA: Diagnosis not present

## 2016-05-30 DIAGNOSIS — G4730 Sleep apnea, unspecified: Secondary | ICD-10-CM | POA: Diagnosis not present

## 2016-05-30 DIAGNOSIS — E7800 Pure hypercholesterolemia, unspecified: Secondary | ICD-10-CM | POA: Diagnosis not present

## 2016-05-30 DIAGNOSIS — K219 Gastro-esophageal reflux disease without esophagitis: Secondary | ICD-10-CM | POA: Diagnosis not present

## 2016-06-12 ENCOUNTER — Other Ambulatory Visit: Payer: Self-pay

## 2016-06-26 DIAGNOSIS — K5900 Constipation, unspecified: Secondary | ICD-10-CM | POA: Diagnosis not present

## 2016-06-26 DIAGNOSIS — R1310 Dysphagia, unspecified: Secondary | ICD-10-CM | POA: Diagnosis not present

## 2016-06-26 DIAGNOSIS — R140 Abdominal distension (gaseous): Secondary | ICD-10-CM | POA: Diagnosis not present

## 2016-06-26 DIAGNOSIS — K219 Gastro-esophageal reflux disease without esophagitis: Secondary | ICD-10-CM | POA: Diagnosis not present

## 2016-06-28 DIAGNOSIS — K208 Other esophagitis: Secondary | ICD-10-CM | POA: Diagnosis not present

## 2016-06-28 DIAGNOSIS — R1310 Dysphagia, unspecified: Secondary | ICD-10-CM | POA: Diagnosis not present

## 2016-06-28 DIAGNOSIS — K200 Eosinophilic esophagitis: Secondary | ICD-10-CM | POA: Diagnosis not present

## 2016-06-28 DIAGNOSIS — K219 Gastro-esophageal reflux disease without esophagitis: Secondary | ICD-10-CM | POA: Diagnosis not present

## 2016-07-10 DIAGNOSIS — Z23 Encounter for immunization: Secondary | ICD-10-CM | POA: Diagnosis not present

## 2016-08-31 DIAGNOSIS — H0288A Meibomian gland dysfunction right eye, upper and lower eyelids: Secondary | ICD-10-CM | POA: Insufficient documentation

## 2016-08-31 DIAGNOSIS — H16223 Keratoconjunctivitis sicca, not specified as Sjogren's, bilateral: Secondary | ICD-10-CM | POA: Insufficient documentation

## 2016-08-31 DIAGNOSIS — H0289 Other specified disorders of eyelid: Secondary | ICD-10-CM | POA: Diagnosis not present

## 2016-09-12 DIAGNOSIS — T1511XA Foreign body in conjunctival sac, right eye, initial encounter: Secondary | ICD-10-CM | POA: Diagnosis not present

## 2016-09-12 DIAGNOSIS — T1512XA Foreign body in conjunctival sac, left eye, initial encounter: Secondary | ICD-10-CM | POA: Diagnosis not present

## 2016-09-12 DIAGNOSIS — H0289 Other specified disorders of eyelid: Secondary | ICD-10-CM | POA: Diagnosis not present

## 2016-09-12 DIAGNOSIS — H16223 Keratoconjunctivitis sicca, not specified as Sjogren's, bilateral: Secondary | ICD-10-CM | POA: Diagnosis not present

## 2016-10-17 DIAGNOSIS — B078 Other viral warts: Secondary | ICD-10-CM | POA: Diagnosis not present

## 2017-01-01 DIAGNOSIS — Z1231 Encounter for screening mammogram for malignant neoplasm of breast: Secondary | ICD-10-CM | POA: Diagnosis not present

## 2017-01-01 DIAGNOSIS — G4730 Sleep apnea, unspecified: Secondary | ICD-10-CM | POA: Diagnosis not present

## 2017-01-01 DIAGNOSIS — Z0000 Encounter for general adult medical examination without abnormal findings: Secondary | ICD-10-CM | POA: Diagnosis not present

## 2017-01-01 DIAGNOSIS — E7800 Pure hypercholesterolemia, unspecified: Secondary | ICD-10-CM | POA: Diagnosis not present

## 2017-01-01 DIAGNOSIS — K210 Gastro-esophageal reflux disease with esophagitis: Secondary | ICD-10-CM | POA: Diagnosis not present

## 2017-01-01 DIAGNOSIS — M47816 Spondylosis without myelopathy or radiculopathy, lumbar region: Secondary | ICD-10-CM | POA: Diagnosis not present

## 2017-01-17 DIAGNOSIS — H401131 Primary open-angle glaucoma, bilateral, mild stage: Secondary | ICD-10-CM | POA: Diagnosis not present

## 2017-02-25 DIAGNOSIS — Z9989 Dependence on other enabling machines and devices: Secondary | ICD-10-CM | POA: Diagnosis not present

## 2017-02-25 DIAGNOSIS — G4733 Obstructive sleep apnea (adult) (pediatric): Secondary | ICD-10-CM | POA: Diagnosis not present

## 2017-02-27 DIAGNOSIS — M722 Plantar fascial fibromatosis: Secondary | ICD-10-CM | POA: Diagnosis not present

## 2017-03-21 DIAGNOSIS — Z1231 Encounter for screening mammogram for malignant neoplasm of breast: Secondary | ICD-10-CM | POA: Diagnosis not present

## 2017-03-29 DIAGNOSIS — L989 Disorder of the skin and subcutaneous tissue, unspecified: Secondary | ICD-10-CM | POA: Diagnosis not present

## 2017-03-29 DIAGNOSIS — B079 Viral wart, unspecified: Secondary | ICD-10-CM | POA: Diagnosis not present

## 2017-04-23 DIAGNOSIS — B079 Viral wart, unspecified: Secondary | ICD-10-CM | POA: Diagnosis not present

## 2017-04-23 DIAGNOSIS — L989 Disorder of the skin and subcutaneous tissue, unspecified: Secondary | ICD-10-CM | POA: Diagnosis not present

## 2017-05-23 DIAGNOSIS — B079 Viral wart, unspecified: Secondary | ICD-10-CM | POA: Diagnosis not present

## 2017-06-12 DIAGNOSIS — B079 Viral wart, unspecified: Secondary | ICD-10-CM | POA: Diagnosis not present

## 2017-07-09 DIAGNOSIS — M47816 Spondylosis without myelopathy or radiculopathy, lumbar region: Secondary | ICD-10-CM | POA: Diagnosis not present

## 2017-07-09 DIAGNOSIS — E7800 Pure hypercholesterolemia, unspecified: Secondary | ICD-10-CM | POA: Diagnosis not present

## 2017-07-09 DIAGNOSIS — F338 Other recurrent depressive disorders: Secondary | ICD-10-CM | POA: Diagnosis not present

## 2017-07-09 DIAGNOSIS — J4520 Mild intermittent asthma, uncomplicated: Secondary | ICD-10-CM | POA: Diagnosis not present

## 2017-07-09 DIAGNOSIS — I700 Atherosclerosis of aorta: Secondary | ICD-10-CM | POA: Diagnosis not present

## 2017-07-09 DIAGNOSIS — Z23 Encounter for immunization: Secondary | ICD-10-CM | POA: Diagnosis not present

## 2017-07-12 DIAGNOSIS — B079 Viral wart, unspecified: Secondary | ICD-10-CM | POA: Diagnosis not present

## 2017-07-12 DIAGNOSIS — L989 Disorder of the skin and subcutaneous tissue, unspecified: Secondary | ICD-10-CM | POA: Diagnosis not present

## 2017-11-01 DIAGNOSIS — H2513 Age-related nuclear cataract, bilateral: Secondary | ICD-10-CM | POA: Diagnosis not present

## 2017-11-01 DIAGNOSIS — H401131 Primary open-angle glaucoma, bilateral, mild stage: Secondary | ICD-10-CM | POA: Diagnosis not present

## 2017-11-01 DIAGNOSIS — H527 Unspecified disorder of refraction: Secondary | ICD-10-CM | POA: Diagnosis not present

## 2017-11-01 DIAGNOSIS — H04123 Dry eye syndrome of bilateral lacrimal glands: Secondary | ICD-10-CM | POA: Diagnosis not present

## 2017-12-05 DIAGNOSIS — J069 Acute upper respiratory infection, unspecified: Secondary | ICD-10-CM | POA: Diagnosis not present

## 2017-12-05 DIAGNOSIS — Z20828 Contact with and (suspected) exposure to other viral communicable diseases: Secondary | ICD-10-CM | POA: Diagnosis not present

## 2017-12-05 DIAGNOSIS — R509 Fever, unspecified: Secondary | ICD-10-CM | POA: Diagnosis not present

## 2017-12-05 DIAGNOSIS — R51 Headache: Secondary | ICD-10-CM | POA: Diagnosis not present

## 2017-12-05 DIAGNOSIS — J9801 Acute bronchospasm: Secondary | ICD-10-CM | POA: Diagnosis not present

## 2017-12-05 DIAGNOSIS — R52 Pain, unspecified: Secondary | ICD-10-CM | POA: Diagnosis not present

## 2017-12-05 DIAGNOSIS — R6883 Chills (without fever): Secondary | ICD-10-CM | POA: Diagnosis not present

## 2017-12-27 DIAGNOSIS — Z86010 Personal history of colonic polyps: Secondary | ICD-10-CM | POA: Diagnosis not present

## 2017-12-27 DIAGNOSIS — D120 Benign neoplasm of cecum: Secondary | ICD-10-CM | POA: Diagnosis not present

## 2017-12-27 DIAGNOSIS — D125 Benign neoplasm of sigmoid colon: Secondary | ICD-10-CM | POA: Diagnosis not present

## 2018-01-15 DIAGNOSIS — F338 Other recurrent depressive disorders: Secondary | ICD-10-CM | POA: Diagnosis not present

## 2018-01-15 DIAGNOSIS — R5383 Other fatigue: Secondary | ICD-10-CM | POA: Diagnosis not present

## 2018-01-15 DIAGNOSIS — J4520 Mild intermittent asthma, uncomplicated: Secondary | ICD-10-CM | POA: Diagnosis not present

## 2018-01-15 DIAGNOSIS — E7800 Pure hypercholesterolemia, unspecified: Secondary | ICD-10-CM | POA: Diagnosis not present

## 2018-01-15 DIAGNOSIS — M47816 Spondylosis without myelopathy or radiculopathy, lumbar region: Secondary | ICD-10-CM | POA: Diagnosis not present

## 2018-01-15 DIAGNOSIS — Z1231 Encounter for screening mammogram for malignant neoplasm of breast: Secondary | ICD-10-CM | POA: Diagnosis not present

## 2018-01-15 DIAGNOSIS — Z0000 Encounter for general adult medical examination without abnormal findings: Secondary | ICD-10-CM | POA: Diagnosis not present

## 2018-01-15 DIAGNOSIS — R635 Abnormal weight gain: Secondary | ICD-10-CM | POA: Diagnosis not present

## 2018-03-20 DIAGNOSIS — Z1231 Encounter for screening mammogram for malignant neoplasm of breast: Secondary | ICD-10-CM | POA: Diagnosis not present

## 2018-04-08 DIAGNOSIS — N6489 Other specified disorders of breast: Secondary | ICD-10-CM | POA: Diagnosis not present

## 2018-04-08 DIAGNOSIS — R928 Other abnormal and inconclusive findings on diagnostic imaging of breast: Secondary | ICD-10-CM | POA: Diagnosis not present

## 2018-06-23 DIAGNOSIS — Z789 Other specified health status: Secondary | ICD-10-CM | POA: Insufficient documentation

## 2018-06-23 DIAGNOSIS — F32A Depression, unspecified: Secondary | ICD-10-CM | POA: Insufficient documentation

## 2018-06-23 DIAGNOSIS — E7800 Pure hypercholesterolemia, unspecified: Secondary | ICD-10-CM | POA: Insufficient documentation

## 2018-06-24 DIAGNOSIS — M545 Low back pain, unspecified: Secondary | ICD-10-CM | POA: Insufficient documentation

## 2018-06-24 DIAGNOSIS — M25551 Pain in right hip: Secondary | ICD-10-CM | POA: Diagnosis not present

## 2018-06-24 DIAGNOSIS — M1611 Unilateral primary osteoarthritis, right hip: Secondary | ICD-10-CM | POA: Diagnosis not present

## 2018-07-07 DIAGNOSIS — M47896 Other spondylosis, lumbar region: Secondary | ICD-10-CM | POA: Diagnosis not present

## 2018-07-07 DIAGNOSIS — M545 Low back pain: Secondary | ICD-10-CM | POA: Diagnosis not present

## 2018-07-07 DIAGNOSIS — M48061 Spinal stenosis, lumbar region without neurogenic claudication: Secondary | ICD-10-CM | POA: Diagnosis not present

## 2018-07-07 DIAGNOSIS — M2578 Osteophyte, vertebrae: Secondary | ICD-10-CM | POA: Diagnosis not present

## 2018-07-23 DIAGNOSIS — K210 Gastro-esophageal reflux disease with esophagitis: Secondary | ICD-10-CM | POA: Diagnosis not present

## 2018-07-23 DIAGNOSIS — Z23 Encounter for immunization: Secondary | ICD-10-CM | POA: Diagnosis not present

## 2018-07-23 DIAGNOSIS — J4520 Mild intermittent asthma, uncomplicated: Secondary | ICD-10-CM | POA: Diagnosis not present

## 2018-07-23 DIAGNOSIS — F338 Other recurrent depressive disorders: Secondary | ICD-10-CM | POA: Diagnosis not present

## 2018-07-23 DIAGNOSIS — M47816 Spondylosis without myelopathy or radiculopathy, lumbar region: Secondary | ICD-10-CM | POA: Diagnosis not present

## 2018-07-24 DIAGNOSIS — M533 Sacrococcygeal disorders, not elsewhere classified: Secondary | ICD-10-CM | POA: Insufficient documentation

## 2018-07-24 DIAGNOSIS — M25551 Pain in right hip: Secondary | ICD-10-CM | POA: Diagnosis not present

## 2018-07-24 DIAGNOSIS — M545 Low back pain: Secondary | ICD-10-CM | POA: Diagnosis not present

## 2018-07-24 DIAGNOSIS — E7800 Pure hypercholesterolemia, unspecified: Secondary | ICD-10-CM | POA: Diagnosis not present

## 2018-08-04 DIAGNOSIS — M533 Sacrococcygeal disorders, not elsewhere classified: Secondary | ICD-10-CM | POA: Diagnosis not present

## 2018-08-21 DIAGNOSIS — M25551 Pain in right hip: Secondary | ICD-10-CM | POA: Diagnosis not present

## 2018-08-27 DIAGNOSIS — N898 Other specified noninflammatory disorders of vagina: Secondary | ICD-10-CM | POA: Diagnosis not present

## 2018-08-27 DIAGNOSIS — R300 Dysuria: Secondary | ICD-10-CM | POA: Diagnosis not present

## 2018-09-03 DIAGNOSIS — M533 Sacrococcygeal disorders, not elsewhere classified: Secondary | ICD-10-CM | POA: Diagnosis not present

## 2018-09-04 ENCOUNTER — Other Ambulatory Visit: Payer: Self-pay

## 2018-09-30 DIAGNOSIS — R300 Dysuria: Secondary | ICD-10-CM | POA: Diagnosis not present

## 2018-10-27 DIAGNOSIS — R8290 Unspecified abnormal findings in urine: Secondary | ICD-10-CM | POA: Diagnosis not present

## 2018-11-20 DIAGNOSIS — R8290 Unspecified abnormal findings in urine: Secondary | ICD-10-CM | POA: Diagnosis not present

## 2018-11-21 DIAGNOSIS — M545 Low back pain: Secondary | ICD-10-CM | POA: Diagnosis not present

## 2019-01-27 DIAGNOSIS — R5383 Other fatigue: Secondary | ICD-10-CM | POA: Diagnosis not present

## 2019-01-27 DIAGNOSIS — E7800 Pure hypercholesterolemia, unspecified: Secondary | ICD-10-CM | POA: Diagnosis not present

## 2019-04-06 DIAGNOSIS — M25561 Pain in right knee: Secondary | ICD-10-CM | POA: Diagnosis not present

## 2019-04-07 DIAGNOSIS — Z9989 Dependence on other enabling machines and devices: Secondary | ICD-10-CM | POA: Diagnosis not present

## 2019-04-07 DIAGNOSIS — G4733 Obstructive sleep apnea (adult) (pediatric): Secondary | ICD-10-CM | POA: Diagnosis not present

## 2019-04-07 DIAGNOSIS — F39 Unspecified mood [affective] disorder: Secondary | ICD-10-CM | POA: Diagnosis not present

## 2019-04-08 DIAGNOSIS — R350 Frequency of micturition: Secondary | ICD-10-CM | POA: Diagnosis not present

## 2019-04-08 DIAGNOSIS — R3915 Urgency of urination: Secondary | ICD-10-CM | POA: Diagnosis not present

## 2019-04-08 DIAGNOSIS — R300 Dysuria: Secondary | ICD-10-CM | POA: Diagnosis not present

## 2019-05-05 DIAGNOSIS — M25561 Pain in right knee: Secondary | ICD-10-CM | POA: Diagnosis not present

## 2019-05-05 DIAGNOSIS — M1711 Unilateral primary osteoarthritis, right knee: Secondary | ICD-10-CM | POA: Diagnosis not present

## 2019-05-13 DIAGNOSIS — R350 Frequency of micturition: Secondary | ICD-10-CM | POA: Diagnosis not present

## 2019-05-13 DIAGNOSIS — R300 Dysuria: Secondary | ICD-10-CM | POA: Diagnosis not present

## 2019-05-13 DIAGNOSIS — R3915 Urgency of urination: Secondary | ICD-10-CM | POA: Diagnosis not present

## 2019-06-15 DIAGNOSIS — H903 Sensorineural hearing loss, bilateral: Secondary | ICD-10-CM | POA: Diagnosis not present

## 2019-06-15 DIAGNOSIS — H9313 Tinnitus, bilateral: Secondary | ICD-10-CM | POA: Diagnosis not present

## 2019-06-15 DIAGNOSIS — H838X3 Other specified diseases of inner ear, bilateral: Secondary | ICD-10-CM | POA: Diagnosis not present

## 2019-06-19 DIAGNOSIS — M25561 Pain in right knee: Secondary | ICD-10-CM | POA: Diagnosis not present

## 2019-07-24 DIAGNOSIS — E785 Hyperlipidemia, unspecified: Secondary | ICD-10-CM | POA: Diagnosis not present

## 2019-07-24 DIAGNOSIS — F338 Other recurrent depressive disorders: Secondary | ICD-10-CM | POA: Diagnosis not present

## 2019-07-24 DIAGNOSIS — Z23 Encounter for immunization: Secondary | ICD-10-CM | POA: Diagnosis not present

## 2019-07-24 DIAGNOSIS — J4520 Mild intermittent asthma, uncomplicated: Secondary | ICD-10-CM | POA: Diagnosis not present

## 2019-07-24 DIAGNOSIS — E7800 Pure hypercholesterolemia, unspecified: Secondary | ICD-10-CM | POA: Diagnosis not present

## 2019-09-09 ENCOUNTER — Other Ambulatory Visit: Payer: Self-pay

## 2019-10-20 DIAGNOSIS — H1131 Conjunctival hemorrhage, right eye: Secondary | ICD-10-CM | POA: Diagnosis not present

## 2020-01-11 DIAGNOSIS — M25561 Pain in right knee: Secondary | ICD-10-CM | POA: Diagnosis not present

## 2020-01-11 DIAGNOSIS — M1711 Unilateral primary osteoarthritis, right knee: Secondary | ICD-10-CM | POA: Diagnosis not present

## 2020-01-26 DIAGNOSIS — R635 Abnormal weight gain: Secondary | ICD-10-CM | POA: Diagnosis not present

## 2020-01-26 DIAGNOSIS — R5383 Other fatigue: Secondary | ICD-10-CM | POA: Diagnosis not present

## 2020-01-26 DIAGNOSIS — Z01419 Encounter for gynecological examination (general) (routine) without abnormal findings: Secondary | ICD-10-CM | POA: Diagnosis not present

## 2020-01-26 DIAGNOSIS — Z0000 Encounter for general adult medical examination without abnormal findings: Secondary | ICD-10-CM | POA: Diagnosis not present

## 2020-01-26 DIAGNOSIS — J4520 Mild intermittent asthma, uncomplicated: Secondary | ICD-10-CM | POA: Diagnosis not present

## 2020-01-26 DIAGNOSIS — E7800 Pure hypercholesterolemia, unspecified: Secondary | ICD-10-CM | POA: Diagnosis not present

## 2020-03-04 DIAGNOSIS — Z1231 Encounter for screening mammogram for malignant neoplasm of breast: Secondary | ICD-10-CM | POA: Diagnosis not present

## 2020-03-04 DIAGNOSIS — Z13820 Encounter for screening for osteoporosis: Secondary | ICD-10-CM | POA: Diagnosis not present

## 2020-03-04 DIAGNOSIS — Z780 Asymptomatic menopausal state: Secondary | ICD-10-CM | POA: Diagnosis not present

## 2020-04-27 DIAGNOSIS — H401131 Primary open-angle glaucoma, bilateral, mild stage: Secondary | ICD-10-CM | POA: Diagnosis not present

## 2020-04-27 DIAGNOSIS — H04123 Dry eye syndrome of bilateral lacrimal glands: Secondary | ICD-10-CM | POA: Diagnosis not present

## 2020-07-19 DIAGNOSIS — Z23 Encounter for immunization: Secondary | ICD-10-CM | POA: Diagnosis not present

## 2020-07-26 DIAGNOSIS — M2550 Pain in unspecified joint: Secondary | ICD-10-CM | POA: Diagnosis not present

## 2020-07-26 DIAGNOSIS — E7800 Pure hypercholesterolemia, unspecified: Secondary | ICD-10-CM | POA: Diagnosis not present

## 2020-07-29 DIAGNOSIS — Z23 Encounter for immunization: Secondary | ICD-10-CM | POA: Diagnosis not present

## 2020-09-06 DIAGNOSIS — K5900 Constipation, unspecified: Secondary | ICD-10-CM | POA: Diagnosis not present

## 2020-09-06 DIAGNOSIS — R1310 Dysphagia, unspecified: Secondary | ICD-10-CM | POA: Diagnosis not present

## 2020-09-06 DIAGNOSIS — K219 Gastro-esophageal reflux disease without esophagitis: Secondary | ICD-10-CM | POA: Diagnosis not present

## 2020-09-14 DIAGNOSIS — K219 Gastro-esophageal reflux disease without esophagitis: Secondary | ICD-10-CM | POA: Diagnosis not present

## 2020-09-14 DIAGNOSIS — R1310 Dysphagia, unspecified: Secondary | ICD-10-CM | POA: Diagnosis not present

## 2020-09-14 DIAGNOSIS — K449 Diaphragmatic hernia without obstruction or gangrene: Secondary | ICD-10-CM | POA: Diagnosis not present

## 2020-09-20 DIAGNOSIS — H00012 Hordeolum externum right lower eyelid: Secondary | ICD-10-CM | POA: Diagnosis not present

## 2020-09-20 DIAGNOSIS — H00014 Hordeolum externum left upper eyelid: Secondary | ICD-10-CM | POA: Diagnosis not present

## 2020-10-08 DIAGNOSIS — N3000 Acute cystitis without hematuria: Secondary | ICD-10-CM | POA: Diagnosis not present

## 2020-10-09 DIAGNOSIS — R300 Dysuria: Secondary | ICD-10-CM | POA: Diagnosis not present

## 2020-12-20 DIAGNOSIS — R8290 Unspecified abnormal findings in urine: Secondary | ICD-10-CM | POA: Diagnosis not present

## 2020-12-20 DIAGNOSIS — R3915 Urgency of urination: Secondary | ICD-10-CM | POA: Diagnosis not present

## 2020-12-20 DIAGNOSIS — R300 Dysuria: Secondary | ICD-10-CM | POA: Diagnosis not present

## 2020-12-28 DIAGNOSIS — M25562 Pain in left knee: Secondary | ICD-10-CM | POA: Diagnosis not present

## 2020-12-28 DIAGNOSIS — M1712 Unilateral primary osteoarthritis, left knee: Secondary | ICD-10-CM | POA: Diagnosis not present

## 2020-12-29 DIAGNOSIS — Z86010 Personal history of colonic polyps: Secondary | ICD-10-CM | POA: Insufficient documentation

## 2020-12-29 DIAGNOSIS — K219 Gastro-esophageal reflux disease without esophagitis: Secondary | ICD-10-CM | POA: Diagnosis not present

## 2021-01-18 DIAGNOSIS — H401131 Primary open-angle glaucoma, bilateral, mild stage: Secondary | ICD-10-CM | POA: Diagnosis not present

## 2021-01-18 DIAGNOSIS — H25813 Combined forms of age-related cataract, bilateral: Secondary | ICD-10-CM | POA: Diagnosis not present

## 2021-01-18 DIAGNOSIS — H52203 Unspecified astigmatism, bilateral: Secondary | ICD-10-CM | POA: Diagnosis not present

## 2021-01-18 DIAGNOSIS — H04123 Dry eye syndrome of bilateral lacrimal glands: Secondary | ICD-10-CM | POA: Diagnosis not present

## 2021-01-18 DIAGNOSIS — H527 Unspecified disorder of refraction: Secondary | ICD-10-CM | POA: Diagnosis not present

## 2021-01-23 DIAGNOSIS — M25562 Pain in left knee: Secondary | ICD-10-CM | POA: Diagnosis not present

## 2021-01-25 DIAGNOSIS — Z01419 Encounter for gynecological examination (general) (routine) without abnormal findings: Secondary | ICD-10-CM | POA: Diagnosis not present

## 2021-01-25 DIAGNOSIS — R5383 Other fatigue: Secondary | ICD-10-CM | POA: Diagnosis not present

## 2021-01-25 DIAGNOSIS — E7800 Pure hypercholesterolemia, unspecified: Secondary | ICD-10-CM | POA: Diagnosis not present

## 2021-01-25 DIAGNOSIS — R200 Anesthesia of skin: Secondary | ICD-10-CM | POA: Diagnosis not present

## 2021-01-25 DIAGNOSIS — Z0000 Encounter for general adult medical examination without abnormal findings: Secondary | ICD-10-CM | POA: Diagnosis not present

## 2021-01-25 DIAGNOSIS — Z5181 Encounter for therapeutic drug level monitoring: Secondary | ICD-10-CM | POA: Diagnosis not present

## 2021-01-25 DIAGNOSIS — F338 Other recurrent depressive disorders: Secondary | ICD-10-CM | POA: Diagnosis not present

## 2021-01-25 DIAGNOSIS — J4520 Mild intermittent asthma, uncomplicated: Secondary | ICD-10-CM | POA: Diagnosis not present

## 2021-03-09 DIAGNOSIS — H52203 Unspecified astigmatism, bilateral: Secondary | ICD-10-CM | POA: Diagnosis not present

## 2021-03-09 DIAGNOSIS — H25813 Combined forms of age-related cataract, bilateral: Secondary | ICD-10-CM | POA: Diagnosis not present

## 2021-03-21 DIAGNOSIS — Z87891 Personal history of nicotine dependence: Secondary | ICD-10-CM | POA: Diagnosis not present

## 2021-03-21 DIAGNOSIS — H25811 Combined forms of age-related cataract, right eye: Secondary | ICD-10-CM | POA: Diagnosis not present

## 2021-03-21 DIAGNOSIS — Z888 Allergy status to other drugs, medicaments and biological substances status: Secondary | ICD-10-CM | POA: Diagnosis not present

## 2021-03-21 DIAGNOSIS — F419 Anxiety disorder, unspecified: Secondary | ICD-10-CM | POA: Diagnosis not present

## 2021-03-21 DIAGNOSIS — H25812 Combined forms of age-related cataract, left eye: Secondary | ICD-10-CM | POA: Insufficient documentation

## 2021-03-21 DIAGNOSIS — K219 Gastro-esophageal reflux disease without esophagitis: Secondary | ICD-10-CM | POA: Diagnosis not present

## 2021-03-21 DIAGNOSIS — E785 Hyperlipidemia, unspecified: Secondary | ICD-10-CM | POA: Diagnosis not present

## 2021-03-21 DIAGNOSIS — Z79899 Other long term (current) drug therapy: Secondary | ICD-10-CM | POA: Diagnosis not present

## 2021-03-21 DIAGNOSIS — G4733 Obstructive sleep apnea (adult) (pediatric): Secondary | ICD-10-CM | POA: Diagnosis not present

## 2021-03-22 DIAGNOSIS — Z961 Presence of intraocular lens: Secondary | ICD-10-CM | POA: Diagnosis not present

## 2021-03-22 DIAGNOSIS — H25811 Combined forms of age-related cataract, right eye: Secondary | ICD-10-CM | POA: Diagnosis not present

## 2021-03-28 DIAGNOSIS — H25812 Combined forms of age-related cataract, left eye: Secondary | ICD-10-CM | POA: Diagnosis not present

## 2021-03-28 DIAGNOSIS — Z79899 Other long term (current) drug therapy: Secondary | ICD-10-CM | POA: Diagnosis not present

## 2021-03-28 DIAGNOSIS — H25813 Combined forms of age-related cataract, bilateral: Secondary | ICD-10-CM | POA: Diagnosis not present

## 2021-03-28 DIAGNOSIS — H25811 Combined forms of age-related cataract, right eye: Secondary | ICD-10-CM | POA: Diagnosis not present

## 2021-03-28 DIAGNOSIS — M1990 Unspecified osteoarthritis, unspecified site: Secondary | ICD-10-CM | POA: Diagnosis not present

## 2021-03-28 DIAGNOSIS — K219 Gastro-esophageal reflux disease without esophagitis: Secondary | ICD-10-CM | POA: Diagnosis not present

## 2021-03-28 DIAGNOSIS — E785 Hyperlipidemia, unspecified: Secondary | ICD-10-CM | POA: Diagnosis not present

## 2021-03-28 DIAGNOSIS — H52203 Unspecified astigmatism, bilateral: Secondary | ICD-10-CM | POA: Diagnosis not present

## 2021-03-28 DIAGNOSIS — G4733 Obstructive sleep apnea (adult) (pediatric): Secondary | ICD-10-CM | POA: Diagnosis not present

## 2021-03-28 DIAGNOSIS — F419 Anxiety disorder, unspecified: Secondary | ICD-10-CM | POA: Diagnosis not present

## 2021-03-28 DIAGNOSIS — H401130 Primary open-angle glaucoma, bilateral, stage unspecified: Secondary | ICD-10-CM | POA: Diagnosis not present

## 2021-03-28 DIAGNOSIS — Z888 Allergy status to other drugs, medicaments and biological substances status: Secondary | ICD-10-CM | POA: Diagnosis not present

## 2021-03-28 DIAGNOSIS — G4730 Sleep apnea, unspecified: Secondary | ICD-10-CM | POA: Diagnosis not present

## 2021-03-28 DIAGNOSIS — Z87891 Personal history of nicotine dependence: Secondary | ICD-10-CM | POA: Diagnosis not present

## 2021-03-28 DIAGNOSIS — H04123 Dry eye syndrome of bilateral lacrimal glands: Secondary | ICD-10-CM | POA: Diagnosis not present

## 2021-03-29 DIAGNOSIS — Z961 Presence of intraocular lens: Secondary | ICD-10-CM | POA: Diagnosis not present

## 2021-04-19 DIAGNOSIS — Z9989 Dependence on other enabling machines and devices: Secondary | ICD-10-CM | POA: Diagnosis not present

## 2021-04-19 DIAGNOSIS — G4733 Obstructive sleep apnea (adult) (pediatric): Secondary | ICD-10-CM | POA: Diagnosis not present

## 2021-04-19 DIAGNOSIS — F39 Unspecified mood [affective] disorder: Secondary | ICD-10-CM | POA: Diagnosis not present

## 2021-07-19 DIAGNOSIS — E785 Hyperlipidemia, unspecified: Secondary | ICD-10-CM | POA: Diagnosis not present

## 2021-07-19 DIAGNOSIS — K2100 Gastro-esophageal reflux disease with esophagitis, without bleeding: Secondary | ICD-10-CM | POA: Diagnosis not present

## 2021-07-19 DIAGNOSIS — J4520 Mild intermittent asthma, uncomplicated: Secondary | ICD-10-CM | POA: Diagnosis not present

## 2021-07-19 DIAGNOSIS — F338 Other recurrent depressive disorders: Secondary | ICD-10-CM | POA: Diagnosis not present

## 2021-07-20 DIAGNOSIS — R002 Palpitations: Secondary | ICD-10-CM | POA: Diagnosis not present

## 2021-07-20 DIAGNOSIS — E7800 Pure hypercholesterolemia, unspecified: Secondary | ICD-10-CM | POA: Diagnosis not present

## 2021-07-30 DIAGNOSIS — Z23 Encounter for immunization: Secondary | ICD-10-CM | POA: Diagnosis not present

## 2021-08-28 DIAGNOSIS — R300 Dysuria: Secondary | ICD-10-CM | POA: Diagnosis not present

## 2021-08-28 DIAGNOSIS — N390 Urinary tract infection, site not specified: Secondary | ICD-10-CM | POA: Diagnosis not present

## 2021-08-29 DIAGNOSIS — R072 Precordial pain: Secondary | ICD-10-CM | POA: Diagnosis not present

## 2021-08-29 DIAGNOSIS — R931 Abnormal findings on diagnostic imaging of heart and coronary circulation: Secondary | ICD-10-CM | POA: Diagnosis not present

## 2021-08-29 DIAGNOSIS — R0602 Shortness of breath: Secondary | ICD-10-CM | POA: Diagnosis not present

## 2021-08-29 DIAGNOSIS — R002 Palpitations: Secondary | ICD-10-CM | POA: Diagnosis not present

## 2021-08-30 DIAGNOSIS — R002 Palpitations: Secondary | ICD-10-CM | POA: Diagnosis not present

## 2021-09-14 DIAGNOSIS — I080 Rheumatic disorders of both mitral and aortic valves: Secondary | ICD-10-CM | POA: Diagnosis not present

## 2021-09-14 DIAGNOSIS — R002 Palpitations: Secondary | ICD-10-CM | POA: Diagnosis not present

## 2021-09-20 DIAGNOSIS — I2510 Atherosclerotic heart disease of native coronary artery without angina pectoris: Secondary | ICD-10-CM | POA: Diagnosis not present

## 2021-09-20 DIAGNOSIS — R072 Precordial pain: Secondary | ICD-10-CM | POA: Diagnosis not present

## 2021-09-20 DIAGNOSIS — K449 Diaphragmatic hernia without obstruction or gangrene: Secondary | ICD-10-CM | POA: Diagnosis not present

## 2021-09-20 DIAGNOSIS — R0602 Shortness of breath: Secondary | ICD-10-CM | POA: Diagnosis not present

## 2021-09-25 DIAGNOSIS — I480 Paroxysmal atrial fibrillation: Secondary | ICD-10-CM | POA: Insufficient documentation

## 2021-09-25 DIAGNOSIS — R072 Precordial pain: Secondary | ICD-10-CM | POA: Insufficient documentation

## 2021-09-28 DIAGNOSIS — I480 Paroxysmal atrial fibrillation: Secondary | ICD-10-CM | POA: Diagnosis not present

## 2021-09-28 DIAGNOSIS — R931 Abnormal findings on diagnostic imaging of heart and coronary circulation: Secondary | ICD-10-CM | POA: Diagnosis not present

## 2021-09-29 DIAGNOSIS — Z888 Allergy status to other drugs, medicaments and biological substances status: Secondary | ICD-10-CM | POA: Diagnosis not present

## 2021-09-29 DIAGNOSIS — K219 Gastro-esophageal reflux disease without esophagitis: Secondary | ICD-10-CM | POA: Diagnosis not present

## 2021-09-29 DIAGNOSIS — G4733 Obstructive sleep apnea (adult) (pediatric): Secondary | ICD-10-CM | POA: Diagnosis not present

## 2021-09-29 DIAGNOSIS — R072 Precordial pain: Secondary | ICD-10-CM | POA: Diagnosis not present

## 2021-09-29 DIAGNOSIS — R7989 Other specified abnormal findings of blood chemistry: Secondary | ICD-10-CM | POA: Diagnosis not present

## 2021-09-29 DIAGNOSIS — R002 Palpitations: Secondary | ICD-10-CM | POA: Diagnosis not present

## 2021-09-29 DIAGNOSIS — R931 Abnormal findings on diagnostic imaging of heart and coronary circulation: Secondary | ICD-10-CM | POA: Diagnosis not present

## 2021-09-29 DIAGNOSIS — R0602 Shortness of breath: Secondary | ICD-10-CM | POA: Diagnosis not present

## 2021-09-29 DIAGNOSIS — E785 Hyperlipidemia, unspecified: Secondary | ICD-10-CM | POA: Diagnosis not present

## 2021-09-29 DIAGNOSIS — R079 Chest pain, unspecified: Secondary | ICD-10-CM | POA: Diagnosis not present

## 2021-10-19 DIAGNOSIS — Z9989 Dependence on other enabling machines and devices: Secondary | ICD-10-CM | POA: Diagnosis not present

## 2021-10-19 DIAGNOSIS — I480 Paroxysmal atrial fibrillation: Secondary | ICD-10-CM | POA: Diagnosis not present

## 2021-10-19 DIAGNOSIS — G4733 Obstructive sleep apnea (adult) (pediatric): Secondary | ICD-10-CM | POA: Diagnosis not present

## 2021-11-14 DIAGNOSIS — M5442 Lumbago with sciatica, left side: Secondary | ICD-10-CM | POA: Diagnosis not present

## 2021-11-14 DIAGNOSIS — M47816 Spondylosis without myelopathy or radiculopathy, lumbar region: Secondary | ICD-10-CM | POA: Diagnosis not present

## 2021-11-14 DIAGNOSIS — Z981 Arthrodesis status: Secondary | ICD-10-CM | POA: Diagnosis not present

## 2021-11-14 DIAGNOSIS — Z7901 Long term (current) use of anticoagulants: Secondary | ICD-10-CM | POA: Diagnosis not present

## 2021-11-14 DIAGNOSIS — Z888 Allergy status to other drugs, medicaments and biological substances status: Secondary | ICD-10-CM | POA: Diagnosis not present

## 2021-11-14 DIAGNOSIS — M79605 Pain in left leg: Secondary | ICD-10-CM | POA: Diagnosis not present

## 2021-11-14 DIAGNOSIS — E785 Hyperlipidemia, unspecified: Secondary | ICD-10-CM | POA: Diagnosis not present

## 2021-11-14 DIAGNOSIS — Z79899 Other long term (current) drug therapy: Secondary | ICD-10-CM | POA: Diagnosis not present

## 2021-11-17 DIAGNOSIS — M47816 Spondylosis without myelopathy or radiculopathy, lumbar region: Secondary | ICD-10-CM | POA: Diagnosis not present

## 2021-11-17 DIAGNOSIS — M4316 Spondylolisthesis, lumbar region: Secondary | ICD-10-CM | POA: Diagnosis not present

## 2021-11-17 DIAGNOSIS — M5136 Other intervertebral disc degeneration, lumbar region: Secondary | ICD-10-CM | POA: Diagnosis not present

## 2021-11-17 DIAGNOSIS — M47817 Spondylosis without myelopathy or radiculopathy, lumbosacral region: Secondary | ICD-10-CM | POA: Diagnosis not present

## 2021-11-20 DIAGNOSIS — M47816 Spondylosis without myelopathy or radiculopathy, lumbar region: Secondary | ICD-10-CM | POA: Diagnosis not present

## 2021-11-20 DIAGNOSIS — R6889 Other general symptoms and signs: Secondary | ICD-10-CM | POA: Diagnosis not present

## 2021-11-20 DIAGNOSIS — M5442 Lumbago with sciatica, left side: Secondary | ICD-10-CM | POA: Diagnosis not present

## 2021-11-29 DIAGNOSIS — M5442 Lumbago with sciatica, left side: Secondary | ICD-10-CM | POA: Diagnosis not present

## 2021-11-29 DIAGNOSIS — R6889 Other general symptoms and signs: Secondary | ICD-10-CM | POA: Diagnosis not present

## 2021-12-05 DIAGNOSIS — M5442 Lumbago with sciatica, left side: Secondary | ICD-10-CM | POA: Diagnosis not present

## 2021-12-05 DIAGNOSIS — R6889 Other general symptoms and signs: Secondary | ICD-10-CM | POA: Diagnosis not present

## 2021-12-15 DIAGNOSIS — R6889 Other general symptoms and signs: Secondary | ICD-10-CM | POA: Diagnosis not present

## 2021-12-15 DIAGNOSIS — M5442 Lumbago with sciatica, left side: Secondary | ICD-10-CM | POA: Diagnosis not present

## 2021-12-18 DIAGNOSIS — Z7901 Long term (current) use of anticoagulants: Secondary | ICD-10-CM | POA: Insufficient documentation

## 2021-12-18 DIAGNOSIS — Z87898 Personal history of other specified conditions: Secondary | ICD-10-CM | POA: Insufficient documentation

## 2021-12-18 DIAGNOSIS — I2510 Atherosclerotic heart disease of native coronary artery without angina pectoris: Secondary | ICD-10-CM | POA: Insufficient documentation

## 2021-12-19 DIAGNOSIS — I2510 Atherosclerotic heart disease of native coronary artery without angina pectoris: Secondary | ICD-10-CM | POA: Diagnosis not present

## 2021-12-19 DIAGNOSIS — Z7901 Long term (current) use of anticoagulants: Secondary | ICD-10-CM | POA: Diagnosis not present

## 2021-12-19 DIAGNOSIS — Z0000 Encounter for general adult medical examination without abnormal findings: Secondary | ICD-10-CM | POA: Diagnosis not present

## 2021-12-19 DIAGNOSIS — I480 Paroxysmal atrial fibrillation: Secondary | ICD-10-CM | POA: Diagnosis not present

## 2021-12-19 DIAGNOSIS — Z87898 Personal history of other specified conditions: Secondary | ICD-10-CM | POA: Diagnosis not present

## 2021-12-28 DIAGNOSIS — R6889 Other general symptoms and signs: Secondary | ICD-10-CM | POA: Diagnosis not present

## 2021-12-28 DIAGNOSIS — M5442 Lumbago with sciatica, left side: Secondary | ICD-10-CM | POA: Diagnosis not present

## 2022-01-17 DIAGNOSIS — K2100 Gastro-esophageal reflux disease with esophagitis, without bleeding: Secondary | ICD-10-CM | POA: Diagnosis not present

## 2022-01-17 DIAGNOSIS — J4520 Mild intermittent asthma, uncomplicated: Secondary | ICD-10-CM | POA: Diagnosis not present

## 2022-01-17 DIAGNOSIS — R5383 Other fatigue: Secondary | ICD-10-CM | POA: Diagnosis not present

## 2022-01-17 DIAGNOSIS — Z01419 Encounter for gynecological examination (general) (routine) without abnormal findings: Secondary | ICD-10-CM | POA: Diagnosis not present

## 2022-01-17 DIAGNOSIS — E7800 Pure hypercholesterolemia, unspecified: Secondary | ICD-10-CM | POA: Diagnosis not present

## 2022-01-17 DIAGNOSIS — F338 Other recurrent depressive disorders: Secondary | ICD-10-CM | POA: Diagnosis not present

## 2022-01-17 DIAGNOSIS — Z0000 Encounter for general adult medical examination without abnormal findings: Secondary | ICD-10-CM | POA: Diagnosis not present

## 2022-01-22 DIAGNOSIS — M25561 Pain in right knee: Secondary | ICD-10-CM | POA: Diagnosis not present

## 2022-01-22 DIAGNOSIS — M1711 Unilateral primary osteoarthritis, right knee: Secondary | ICD-10-CM | POA: Diagnosis not present

## 2022-01-25 ENCOUNTER — Encounter: Payer: Self-pay | Admitting: Podiatry

## 2022-01-25 ENCOUNTER — Ambulatory Visit (INDEPENDENT_AMBULATORY_CARE_PROVIDER_SITE_OTHER): Payer: Medicare Other | Admitting: Podiatry

## 2022-01-25 ENCOUNTER — Ambulatory Visit (INDEPENDENT_AMBULATORY_CARE_PROVIDER_SITE_OTHER): Payer: Medicare Other

## 2022-01-25 ENCOUNTER — Other Ambulatory Visit: Payer: Self-pay | Admitting: *Deleted

## 2022-01-25 ENCOUNTER — Encounter: Payer: Self-pay | Admitting: *Deleted

## 2022-01-25 DIAGNOSIS — M7660 Achilles tendinitis, unspecified leg: Secondary | ICD-10-CM

## 2022-01-25 DIAGNOSIS — M79672 Pain in left foot: Secondary | ICD-10-CM | POA: Diagnosis not present

## 2022-01-25 DIAGNOSIS — M7662 Achilles tendinitis, left leg: Secondary | ICD-10-CM | POA: Diagnosis not present

## 2022-01-25 DIAGNOSIS — G8929 Other chronic pain: Secondary | ICD-10-CM

## 2022-01-25 MED ORDER — METHYLPREDNISOLONE 4 MG PO TBPK: ORAL_TABLET | ORAL | 0 refills | Status: AC

## 2022-01-25 NOTE — Patient Instructions (Signed)
Achilles Tendinitis  ?with Rehab ?Achilles tendinitis is a disorder of the Achilles tendon. The Achilles tendon connects the large calf muscles (Gastrocnemius and Soleus) to the heel bone (calcaneus). This tendon is sometimes called the heel cord. It is important for pushing-off and standing on your toes and is important for walking, running, or jumping. Tendinitis is often caused by overuse and repetitive microtrauma. ?SYMPTOMS ?Pain, tenderness, swelling, warmth, and redness may occur over the Achilles tendon even at rest. ?Pain with pushing off, or flexing or extending the ankle. ?Pain that is worsened after or during activity. ?CAUSES  ?Overuse sometimes seen with rapid increase in exercise programs or in sports requiring running and jumping. ?Poor physical conditioning (strength and flexibility or endurance). ?Running sports, especially training running down hills. ?Inadequate warm-up before practice or play or failure to stretch before participation. ?Injury to the tendon. ?PREVENTION  ?Warm up and stretch before practice or competition. ?Allow time for adequate rest and recovery between practices and competition. ?Keep up conditioning. ?Keep up ankle and leg flexibility. ?Improve or keep muscle strength and endurance. ?Improve cardiovascular fitness. ?Use proper technique. ?Use proper equipment (shoes, skates). ?To help prevent recurrence, taping, protective strapping, or an adhesive bandage may be recommended for several weeks after healing is complete. ?PROGNOSIS  ?Recovery may take weeks to several months to heal. ?Longer recovery is expected if symptoms have been prolonged. ?Recovery is usually quicker if the inflammation is due to a direct blow as compared with overuse or sudden strain. ?RELATED COMPLICATIONS  ?Healing time will be prolonged if the condition is not correctly treated. The injury must be given plenty of time to heal. ?Symptoms can reoccur if activity is resumed too soon. ?Untreated,  tendinitis may increase the risk of tendon rupture requiring additional time for recovery and possibly surgery. ?TREATMENT  ?The first treatment consists of rest anti-inflammatory medication, and ice to relieve the pain. ?Stretching and strengthening exercises after resolution of pain will likely help reduce the risk of recurrence. Referral to a physical therapist or athletic trainer for further evaluation and treatment may be helpful. ?A walking boot or cast may be recommended to rest the Achilles tendon. This can help break the cycle of inflammation and microtrauma. ?Arch supports (orthotics) may be prescribed or recommended by your caregiver as an adjunct to therapy and rest. ?Surgery to remove the inflamed tendon lining or degenerated tendon tissue is rarely necessary and has shown less than predictable results. ?MEDICATION  ?Nonsteroidal anti-inflammatory medications, such as aspirin and ibuprofen, may be used for pain and inflammation relief. Do not take within 7 days before surgery. Take these as directed by your caregiver. Contact your caregiver immediately if any bleeding, stomach upset, or signs of allergic reaction occur. Other minor pain relievers, such as acetaminophen, may also be used. ?Pain relievers may be prescribed as necessary by your caregiver. Do not take prescription pain medication for longer than 4 to 7 days. Use only as directed and only as much as you need. ?Cortisone injections are rarely indicated. Cortisone injections may weaken tendons and predispose to rupture. It is better to give the condition more time to heal than to use them. ?HEAT AND COLD ?Cold is used to relieve pain and reduce inflammation for acute and chronic Achilles tendinitis. Cold should be applied for 10 to 15 minutes every 2 to 3 hours for inflammation and pain and immediately after any activity that aggravates your symptoms. Use ice packs or an ice massage. ?Heat may be used before performing stretching  and  strengthening activities prescribed by your caregiver. Use a heat pack or a warm soak. ?SEEK MEDICAL CARE IF: ?Symptoms get worse or do not improve in 2 weeks despite treatment. ?New, unexplained symptoms develop. Drugs used in treatment may produce side effects. ? ?EXERCISES: ? ?RANGE OF MOTION (ROM) AND STRETCHING EXERCISES - Achilles Tendinitis  ?These exercises may help you when beginning to rehabilitate your injury. Your symptoms may resolve with or without further involvement from your physician, physical therapist or athletic trainer. While completing these exercises, remember:  ?Restoring tissue flexibility helps normal motion to return to the joints. This allows healthier, less painful movement and activity. ?An effective stretch should be held for at least 30 seconds. ?A stretch should never be painful. You should only feel a gentle lengthening or release in the stretched tissue. ? ?STRETCH  Gastroc, Standing  ?Place hands on wall. ?Extend right / left leg, keeping the front knee somewhat bent. ?Slightly point your toes inward on your back foot. ?Keeping your right / left heel on the floor and your knee straight, shift your weight toward the wall, not allowing your back to arch. ?You should feel a gentle stretch in the right / left calf. Hold this position for 10 seconds. ?Repeat 3 times. Complete this stretch 2 times per day. ? ?STRETCH  Soleus, Standing  ?Place hands on wall. ?Extend right / left leg, keeping the other knee somewhat bent. ?Slightly point your toes inward on your back foot. ?Keep your right / left heel on the floor, bend your back knee, and slightly shift your weight over the back leg so that you feel a gentle stretch deep in your back calf. ?Hold this position for 10 seconds. ?Repeat 3 times. Complete this stretch 2 times per day. ? ?[employer], Standing  ?Note: This exercise can place a lot of stress on your foot and ankle. Please complete this exercise only if specifically  instructed by your caregiver.  ?Place the ball of your right / left foot on a step, keeping your other foot firmly on the same step. ?Hold on to the wall or a rail for balance. ?Slowly lift your other foot, allowing your body weight to press your heel down over the edge of the step. ?You should feel a stretch in your right / left calf. ?Hold this position for 10 seconds. ?Repeat this exercise with a slight bend in your knee. ?Repeat 3 times. Complete this stretch 2 times per day.  ? ?STRENGTHENING EXERCISES - Achilles Tendinitis ?These exercises may help you when beginning to rehabilitate your injury. They may resolve your symptoms with or without further involvement from your physician, physical therapist or athletic trainer. While completing these exercises, remember:  ?Muscles can gain both the endurance and the strength needed for everyday activities through controlled exercises. ?Complete these exercises as instructed by your physician, physical therapist or athletic trainer. Progress the resistance and repetitions only as guided. ?You may experience muscle soreness or fatigue, but the pain or discomfort you are trying to eliminate should never worsen during these exercises. If this pain does worsen, stop and make certain you are following the directions exactly. If the pain is still present after adjustments, discontinue the exercise until you can discuss the trouble with your clinician. ? ?STRENGTH - Plantar-flexors  ?Sit with your right / left leg extended. Holding onto both ends of a rubber exercise band/tubing, loop it around the ball of your foot. Keep a slight tension in the band. ?Slowly  push your toes away from you, pointing them downward. ?Hold this position for 10 seconds. Return slowly, controlling the tension in the band/tubing. ?Repeat 3 times. Complete this exercise 2 times per day.  ? ?STRENGTH - Plantar-flexors  ?Stand with your feet shoulder width apart. Steady yourself with a wall or table  using as little support as needed. ?Keeping your weight evenly spread over the width of your feet, rise up on your toes.* ?Hold this position for 10 seconds. ?Repeat 3 times. Complete this exercise 2 times per day.  ?

## 2022-01-25 NOTE — Progress Notes (Signed)
?  Subjective:  ?Patient ID: Linda Patel, female    DOB: 29-May-1951,   MRN: [id number] ? ?No chief complaint on file. ? ? ?71 y.o. female presents for concern of left heel knot  . Denies any other pedal complaints. Denies n/v/f/c.  ? ?No past medical history on file. ? ?Objective:  ?Physical Exam: ?Vascular: DP/PT pulses 2/4 bilateral. CFT <3 seconds. Normal hair growth on digits. No edema.  ?Skin. No lacerations or abrasions bilateral feet.  ?Musculoskeletal: MMT 5/5 bilateral lower extremities in DF, PF, Inversion and Eversion. Deceased ROM in DF of ankle joint. Tender to insertion of achilles tendon. No pain proximally along tendon. No pain to medial calcaneal tubercle. No pain with DF/PF.  ?Neurological: Sensation intact to light touch.  ? ?Assessment:  ? ?1. Achilles tendon pain   ?2. Heel pain, chronic, left   ? ? ? ?Plan:  ?Patient was evaluated and treated and all questions answered. ?X-rays reviewed and discussed with patient. No acute fractures or dislocations noted.  Spurring noted to posterior and inferior calcaneus. 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  ?-Xrays reviewed ?-Discussed Achilles insertional tendonitis and treatment options with patient.  ?-Discussed stretching exercises. ?-Rx Medrol dose pack  provided  ?-Heel lifts provided and discussed proper shoewear.  ?-Discussed if no improvement will consider MRI/PT/EPAT/PRP injections.  ?-Patient to return to office in 6 weeks for re-evaluation.  ? ? ? ? ?Lorenda Peck, DPM  ? ? ?

## 2022-01-25 NOTE — Addendum Note (Signed)
Addended by: Cranford Mon R on: 01/25/2022 09:35 AM ? ? Modules accepted: Orders ? ?

## 2022-01-31 DIAGNOSIS — Z86010 Personal history of colonic polyps: Secondary | ICD-10-CM | POA: Diagnosis not present

## 2022-01-31 DIAGNOSIS — K219 Gastro-esophageal reflux disease without esophagitis: Secondary | ICD-10-CM | POA: Diagnosis not present

## 2022-01-31 DIAGNOSIS — R1310 Dysphagia, unspecified: Secondary | ICD-10-CM | POA: Diagnosis not present

## 2022-02-15 DIAGNOSIS — H401131 Primary open-angle glaucoma, bilateral, mild stage: Secondary | ICD-10-CM | POA: Diagnosis not present

## 2022-02-15 DIAGNOSIS — H43393 Other vitreous opacities, bilateral: Secondary | ICD-10-CM | POA: Diagnosis not present

## 2022-02-15 DIAGNOSIS — H26492 Other secondary cataract, left eye: Secondary | ICD-10-CM | POA: Diagnosis not present

## 2022-02-15 DIAGNOSIS — H02825 Cysts of left lower eyelid: Secondary | ICD-10-CM | POA: Diagnosis not present

## 2022-02-15 DIAGNOSIS — H04123 Dry eye syndrome of bilateral lacrimal glands: Secondary | ICD-10-CM | POA: Diagnosis not present

## 2022-02-15 DIAGNOSIS — Z961 Presence of intraocular lens: Secondary | ICD-10-CM | POA: Diagnosis not present

## 2022-02-28 DIAGNOSIS — I480 Paroxysmal atrial fibrillation: Secondary | ICD-10-CM | POA: Diagnosis not present

## 2022-02-28 DIAGNOSIS — I2510 Atherosclerotic heart disease of native coronary artery without angina pectoris: Secondary | ICD-10-CM | POA: Diagnosis not present

## 2022-03-09 ENCOUNTER — Ambulatory Visit: Payer: Medicare Other | Admitting: Podiatry

## 2022-03-13 DIAGNOSIS — F411 Generalized anxiety disorder: Secondary | ICD-10-CM | POA: Diagnosis not present

## 2022-03-13 DIAGNOSIS — Z5181 Encounter for therapeutic drug level monitoring: Secondary | ICD-10-CM | POA: Diagnosis not present

## 2022-03-13 DIAGNOSIS — T466X5A Adverse effect of antihyperlipidemic and antiarteriosclerotic drugs, initial encounter: Secondary | ICD-10-CM | POA: Diagnosis not present

## 2022-03-13 DIAGNOSIS — F410 Panic disorder [episodic paroxysmal anxiety] without agoraphobia: Secondary | ICD-10-CM | POA: Diagnosis not present

## 2022-03-13 DIAGNOSIS — F39 Unspecified mood [affective] disorder: Secondary | ICD-10-CM | POA: Diagnosis not present

## 2022-03-13 DIAGNOSIS — I480 Paroxysmal atrial fibrillation: Secondary | ICD-10-CM | POA: Diagnosis not present

## 2022-03-13 DIAGNOSIS — M791 Myalgia, unspecified site: Secondary | ICD-10-CM | POA: Diagnosis not present

## 2022-03-13 DIAGNOSIS — E7849 Other hyperlipidemia: Secondary | ICD-10-CM | POA: Diagnosis not present

## 2022-03-14 DIAGNOSIS — M25561 Pain in right knee: Secondary | ICD-10-CM | POA: Diagnosis not present

## 2022-03-14 DIAGNOSIS — M25562 Pain in left knee: Secondary | ICD-10-CM | POA: Diagnosis not present

## 2022-03-19 DIAGNOSIS — M85831 Other specified disorders of bone density and structure, right forearm: Secondary | ICD-10-CM | POA: Diagnosis not present

## 2022-03-19 DIAGNOSIS — Z1231 Encounter for screening mammogram for malignant neoplasm of breast: Secondary | ICD-10-CM | POA: Diagnosis not present

## 2022-03-19 DIAGNOSIS — Z780 Asymptomatic menopausal state: Secondary | ICD-10-CM | POA: Diagnosis not present

## 2022-03-19 DIAGNOSIS — Z13820 Encounter for screening for osteoporosis: Secondary | ICD-10-CM | POA: Diagnosis not present

## 2022-04-12 DIAGNOSIS — G8918 Other acute postprocedural pain: Secondary | ICD-10-CM | POA: Diagnosis not present

## 2022-04-12 DIAGNOSIS — M1712 Unilateral primary osteoarthritis, left knee: Secondary | ICD-10-CM | POA: Diagnosis not present

## 2022-04-13 DIAGNOSIS — H25813 Combined forms of age-related cataract, bilateral: Secondary | ICD-10-CM | POA: Diagnosis not present

## 2022-04-13 DIAGNOSIS — I10 Essential (primary) hypertension: Secondary | ICD-10-CM | POA: Diagnosis not present

## 2022-04-13 DIAGNOSIS — F411 Generalized anxiety disorder: Secondary | ICD-10-CM | POA: Diagnosis not present

## 2022-04-13 DIAGNOSIS — Z96652 Presence of left artificial knee joint: Secondary | ICD-10-CM | POA: Diagnosis not present

## 2022-04-13 DIAGNOSIS — E7849 Other hyperlipidemia: Secondary | ICD-10-CM | POA: Diagnosis not present

## 2022-04-13 DIAGNOSIS — K219 Gastro-esophageal reflux disease without esophagitis: Secondary | ICD-10-CM | POA: Diagnosis not present

## 2022-04-13 DIAGNOSIS — Z7901 Long term (current) use of anticoagulants: Secondary | ICD-10-CM | POA: Diagnosis not present

## 2022-04-13 DIAGNOSIS — G4733 Obstructive sleep apnea (adult) (pediatric): Secondary | ICD-10-CM | POA: Diagnosis not present

## 2022-04-13 DIAGNOSIS — Z4789 Encounter for other orthopedic aftercare: Secondary | ICD-10-CM | POA: Diagnosis not present

## 2022-04-13 DIAGNOSIS — M791 Myalgia, unspecified site: Secondary | ICD-10-CM | POA: Diagnosis not present

## 2022-04-13 DIAGNOSIS — T466X5D Adverse effect of antihyperlipidemic and antiarteriosclerotic drugs, subsequent encounter: Secondary | ICD-10-CM | POA: Diagnosis not present

## 2022-04-13 DIAGNOSIS — Z9989 Dependence on other enabling machines and devices: Secondary | ICD-10-CM | POA: Diagnosis not present

## 2022-04-13 DIAGNOSIS — I480 Paroxysmal atrial fibrillation: Secondary | ICD-10-CM | POA: Diagnosis not present

## 2022-04-13 DIAGNOSIS — I2510 Atherosclerotic heart disease of native coronary artery without angina pectoris: Secondary | ICD-10-CM | POA: Diagnosis not present

## 2022-04-13 DIAGNOSIS — Z86010 Personal history of colonic polyps: Secondary | ICD-10-CM | POA: Diagnosis not present

## 2022-04-13 DIAGNOSIS — F410 Panic disorder [episodic paroxysmal anxiety] without agoraphobia: Secondary | ICD-10-CM | POA: Diagnosis not present

## 2022-04-16 DIAGNOSIS — Z4789 Encounter for other orthopedic aftercare: Secondary | ICD-10-CM | POA: Diagnosis not present

## 2022-04-16 DIAGNOSIS — I480 Paroxysmal atrial fibrillation: Secondary | ICD-10-CM | POA: Diagnosis not present

## 2022-04-16 DIAGNOSIS — T466X5D Adverse effect of antihyperlipidemic and antiarteriosclerotic drugs, subsequent encounter: Secondary | ICD-10-CM | POA: Diagnosis not present

## 2022-04-16 DIAGNOSIS — Z7901 Long term (current) use of anticoagulants: Secondary | ICD-10-CM | POA: Diagnosis not present

## 2022-04-16 DIAGNOSIS — M791 Myalgia, unspecified site: Secondary | ICD-10-CM | POA: Diagnosis not present

## 2022-04-16 DIAGNOSIS — Z96652 Presence of left artificial knee joint: Secondary | ICD-10-CM | POA: Diagnosis not present

## 2022-04-18 DIAGNOSIS — Z7901 Long term (current) use of anticoagulants: Secondary | ICD-10-CM | POA: Diagnosis not present

## 2022-04-18 DIAGNOSIS — I480 Paroxysmal atrial fibrillation: Secondary | ICD-10-CM | POA: Diagnosis not present

## 2022-04-18 DIAGNOSIS — Z96652 Presence of left artificial knee joint: Secondary | ICD-10-CM | POA: Diagnosis not present

## 2022-04-18 DIAGNOSIS — M791 Myalgia, unspecified site: Secondary | ICD-10-CM | POA: Diagnosis not present

## 2022-04-18 DIAGNOSIS — T466X5D Adverse effect of antihyperlipidemic and antiarteriosclerotic drugs, subsequent encounter: Secondary | ICD-10-CM | POA: Diagnosis not present

## 2022-04-18 DIAGNOSIS — Z4789 Encounter for other orthopedic aftercare: Secondary | ICD-10-CM | POA: Diagnosis not present

## 2022-04-24 DIAGNOSIS — Z4789 Encounter for other orthopedic aftercare: Secondary | ICD-10-CM | POA: Diagnosis not present

## 2022-04-24 DIAGNOSIS — I480 Paroxysmal atrial fibrillation: Secondary | ICD-10-CM | POA: Diagnosis not present

## 2022-04-24 DIAGNOSIS — M791 Myalgia, unspecified site: Secondary | ICD-10-CM | POA: Diagnosis not present

## 2022-04-24 DIAGNOSIS — T466X5D Adverse effect of antihyperlipidemic and antiarteriosclerotic drugs, subsequent encounter: Secondary | ICD-10-CM | POA: Diagnosis not present

## 2022-04-24 DIAGNOSIS — Z96652 Presence of left artificial knee joint: Secondary | ICD-10-CM | POA: Diagnosis not present

## 2022-04-24 DIAGNOSIS — Z7901 Long term (current) use of anticoagulants: Secondary | ICD-10-CM | POA: Diagnosis not present

## 2022-04-25 DIAGNOSIS — M25562 Pain in left knee: Secondary | ICD-10-CM | POA: Diagnosis not present

## 2022-04-26 DIAGNOSIS — T466X5D Adverse effect of antihyperlipidemic and antiarteriosclerotic drugs, subsequent encounter: Secondary | ICD-10-CM | POA: Diagnosis not present

## 2022-04-26 DIAGNOSIS — Z96652 Presence of left artificial knee joint: Secondary | ICD-10-CM | POA: Diagnosis not present

## 2022-04-26 DIAGNOSIS — Z7901 Long term (current) use of anticoagulants: Secondary | ICD-10-CM | POA: Diagnosis not present

## 2022-04-26 DIAGNOSIS — I480 Paroxysmal atrial fibrillation: Secondary | ICD-10-CM | POA: Diagnosis not present

## 2022-04-26 DIAGNOSIS — Z4789 Encounter for other orthopedic aftercare: Secondary | ICD-10-CM | POA: Diagnosis not present

## 2022-04-26 DIAGNOSIS — M791 Myalgia, unspecified site: Secondary | ICD-10-CM | POA: Diagnosis not present

## 2022-05-07 DIAGNOSIS — M25562 Pain in left knee: Secondary | ICD-10-CM | POA: Diagnosis not present

## 2022-05-07 DIAGNOSIS — G8929 Other chronic pain: Secondary | ICD-10-CM | POA: Diagnosis not present

## 2022-05-09 DIAGNOSIS — R6889 Other general symptoms and signs: Secondary | ICD-10-CM | POA: Diagnosis not present

## 2022-05-09 DIAGNOSIS — M25562 Pain in left knee: Secondary | ICD-10-CM | POA: Diagnosis not present

## 2022-05-16 DIAGNOSIS — R6889 Other general symptoms and signs: Secondary | ICD-10-CM | POA: Diagnosis not present

## 2022-05-16 DIAGNOSIS — M25562 Pain in left knee: Secondary | ICD-10-CM | POA: Diagnosis not present

## 2022-05-23 DIAGNOSIS — R6889 Other general symptoms and signs: Secondary | ICD-10-CM | POA: Diagnosis not present

## 2022-05-23 DIAGNOSIS — M25562 Pain in left knee: Secondary | ICD-10-CM | POA: Diagnosis not present

## 2022-05-24 DIAGNOSIS — H401133 Primary open-angle glaucoma, bilateral, severe stage: Secondary | ICD-10-CM | POA: Diagnosis not present

## 2022-06-29 DIAGNOSIS — H401130 Primary open-angle glaucoma, bilateral, stage unspecified: Secondary | ICD-10-CM | POA: Diagnosis not present

## 2022-08-02 DIAGNOSIS — Z23 Encounter for immunization: Secondary | ICD-10-CM | POA: Diagnosis not present

## 2022-09-12 DIAGNOSIS — M25561 Pain in right knee: Secondary | ICD-10-CM | POA: Diagnosis not present

## 2022-09-12 DIAGNOSIS — Z96652 Presence of left artificial knee joint: Secondary | ICD-10-CM | POA: Diagnosis not present

## 2022-10-01 DIAGNOSIS — H524 Presbyopia: Secondary | ICD-10-CM | POA: Diagnosis not present

## 2022-10-01 DIAGNOSIS — H26493 Other secondary cataract, bilateral: Secondary | ICD-10-CM | POA: Diagnosis not present

## 2022-10-01 DIAGNOSIS — Z961 Presence of intraocular lens: Secondary | ICD-10-CM | POA: Diagnosis not present

## 2022-10-01 DIAGNOSIS — H401131 Primary open-angle glaucoma, bilateral, mild stage: Secondary | ICD-10-CM | POA: Diagnosis not present

## 2022-10-01 DIAGNOSIS — H35362 Drusen (degenerative) of macula, left eye: Secondary | ICD-10-CM | POA: Diagnosis not present

## 2022-10-01 DIAGNOSIS — H526 Other disorders of refraction: Secondary | ICD-10-CM | POA: Diagnosis not present

## 2023-03-16 IMAGING — DX DG FOOT COMPLETE 3+V*L*
3 series · 3 of 3 positions shown · non-contrast
Comparison: None.

CLINICAL DATA: Left heel pain for 3 days. History of hammertoe
surgery.

EXAM:
LEFT FOOT - COMPLETE 3+ VIEW

[foot ap wb]
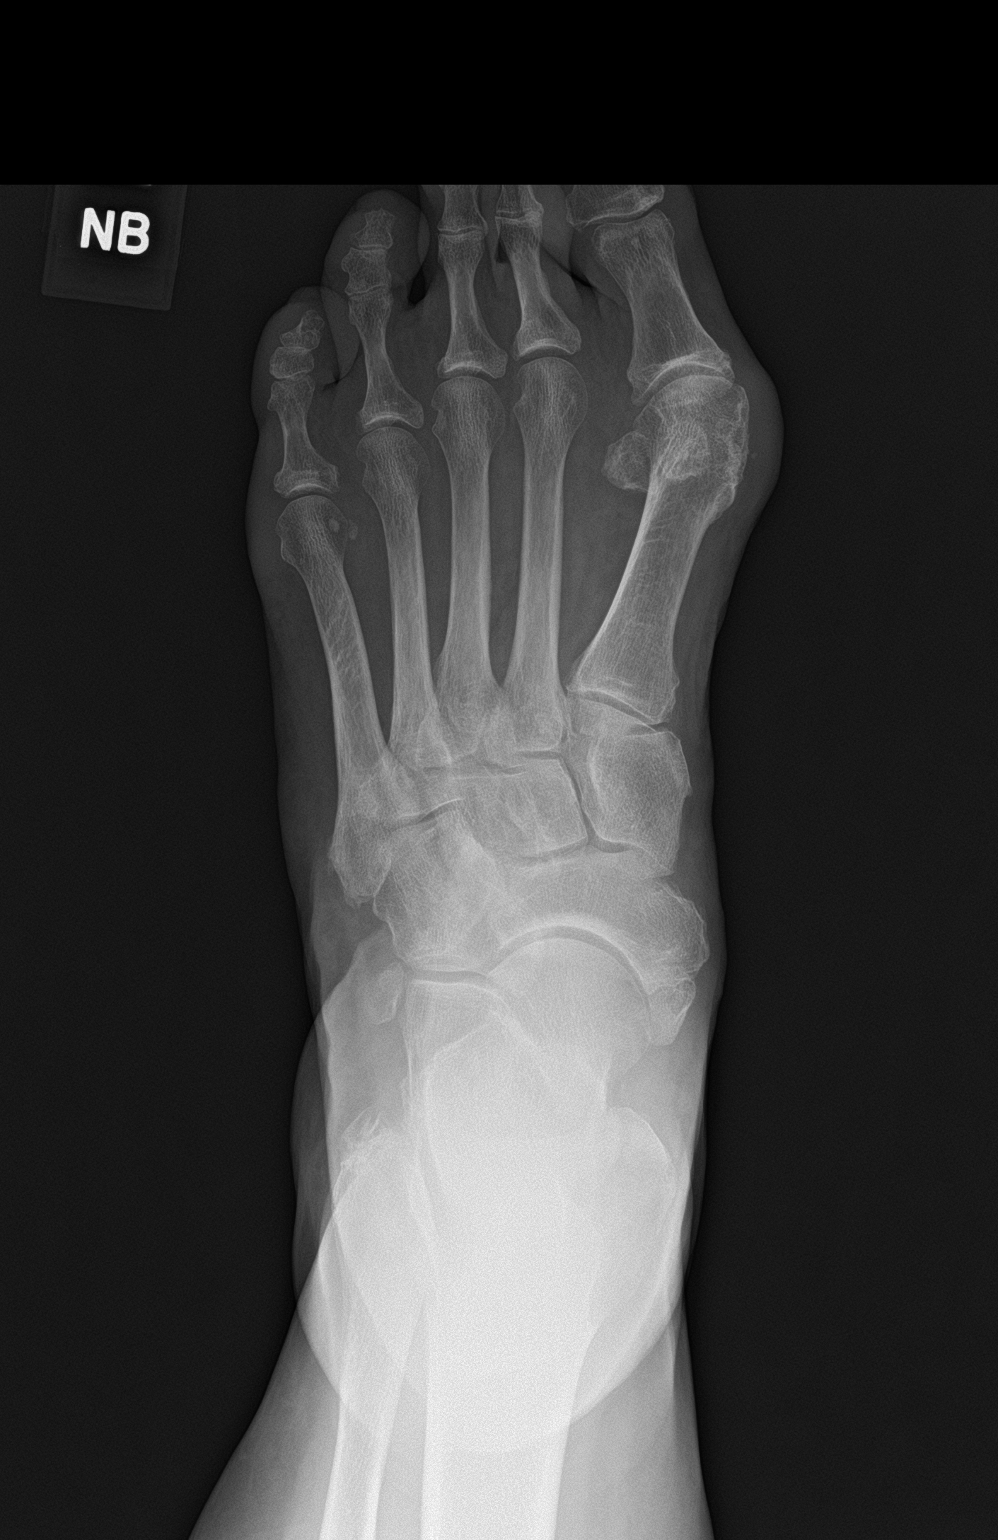

[foot obl wb]
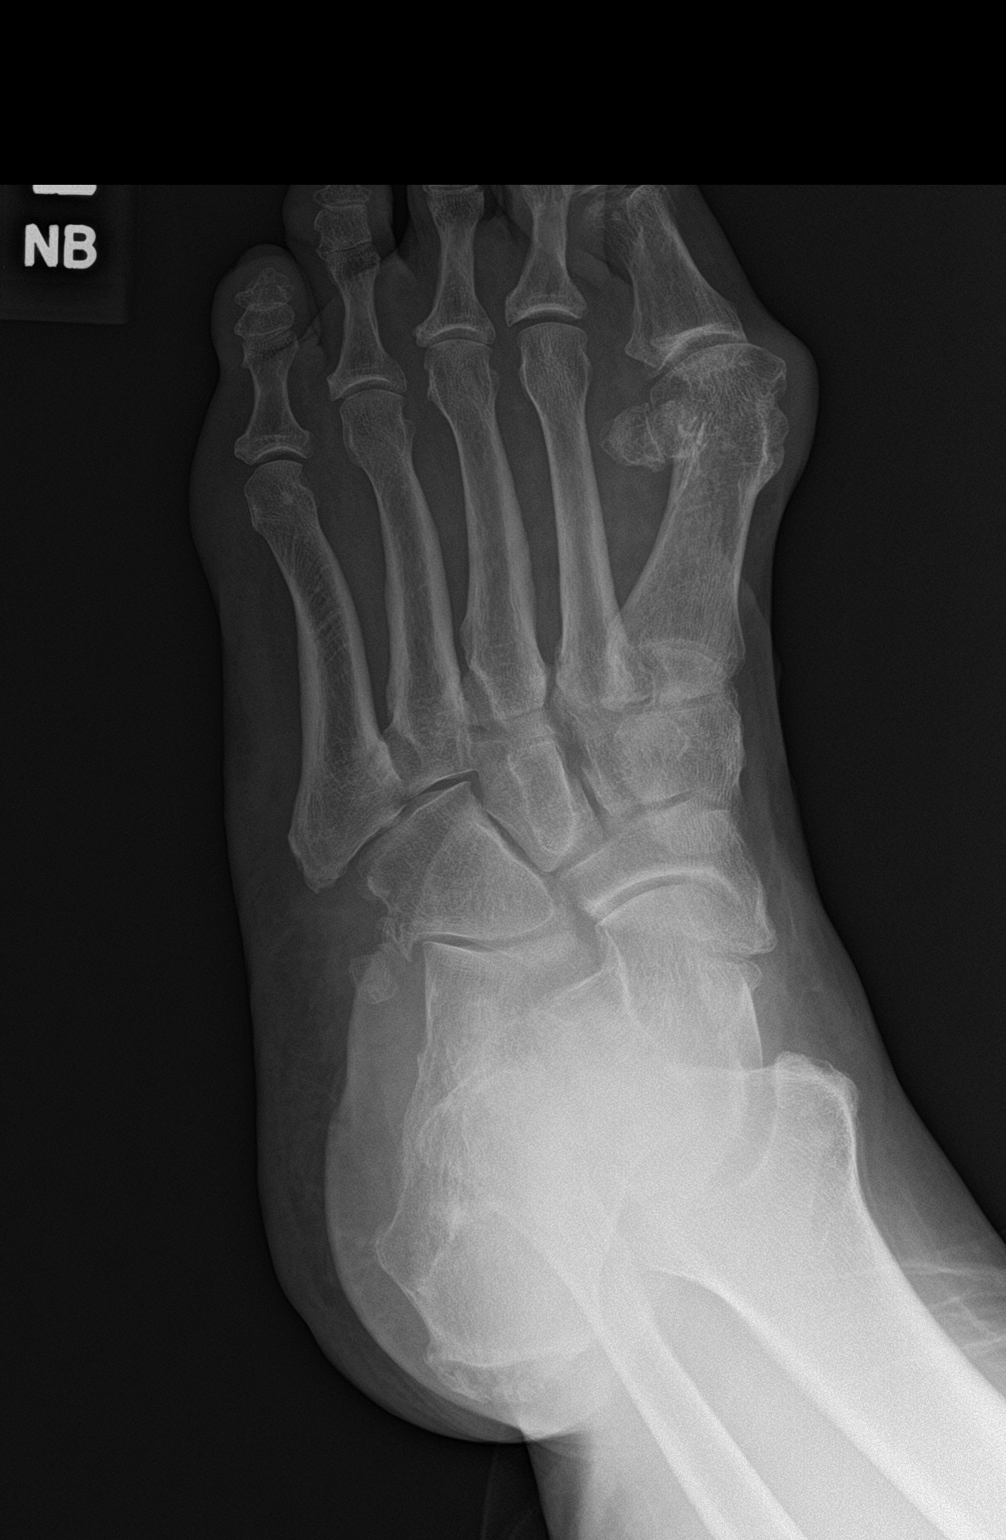

[foot lat wb]
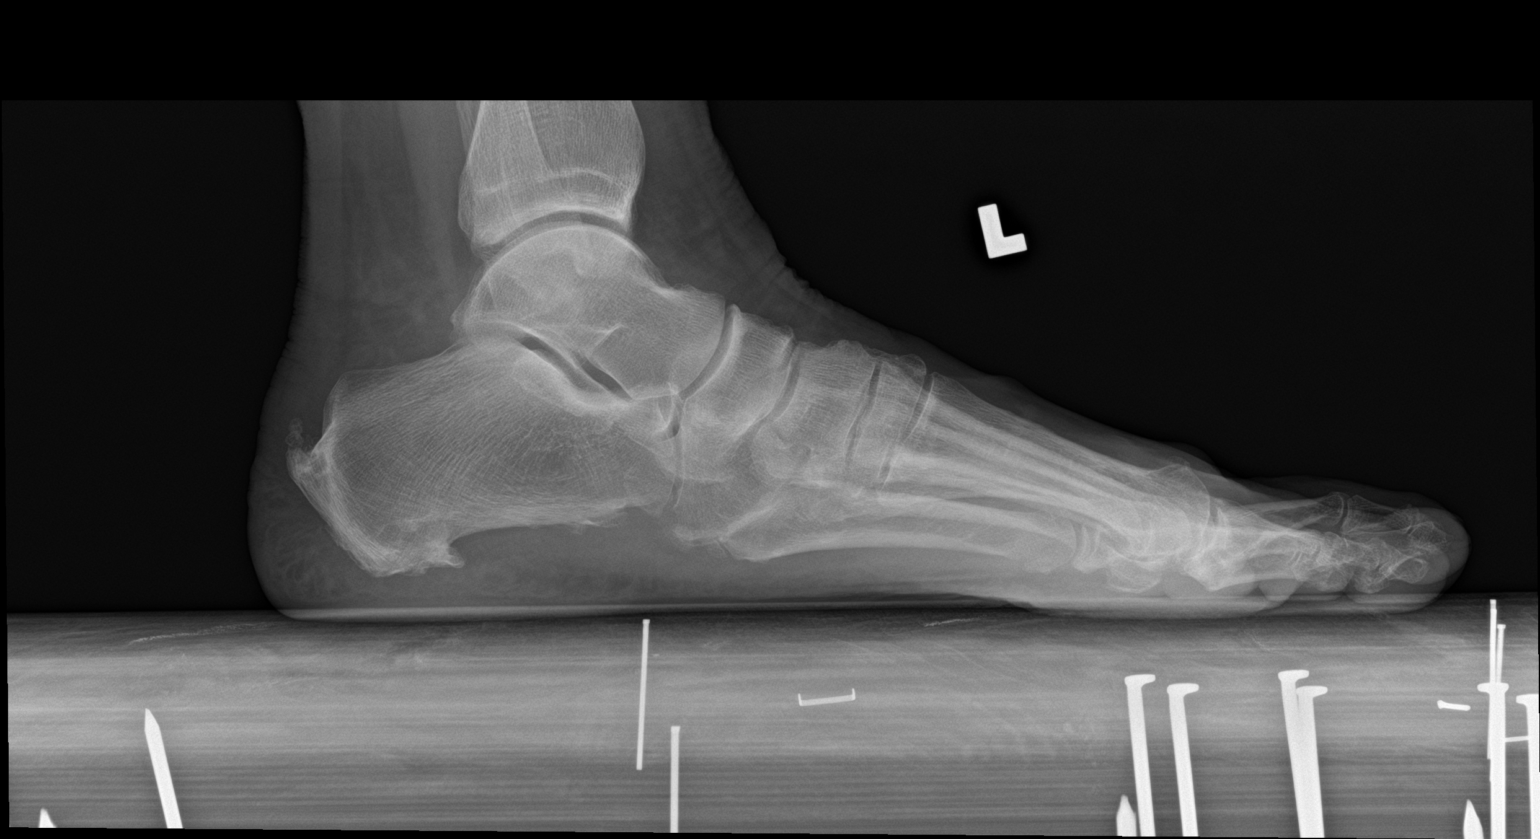

[3 of 3 positions shown; findings below may reference images not displayed]

FINDINGS: Moderate hallux valgus. Probable prior partial resection of the
medial aspect of the great toe metatarsal head with mild subcortical
cystic changes in this region. Mild-to-moderate great toe
metatarsophalangeal joint space narrowing and peripheral
osteophytosis. Mild joint space narrowing of the interphalangeal
joints diffusely. Moderate plantar calcaneal heel spur.
Mild-to-moderate chronic spurring at the Achilles insertion on the
calcaneus. Mild dorsal talonavicular degenerative osteophytosis. No
acute fracture or dislocation.
IMPRESSION: :
IMPRESSION: 1. Moderate hallux valgus. Likely prior partial resection of the
medial aspect of the great toe metatarsal head.
2. Mild-to-moderate great toe metatarsophalangeal joint
osteoarthritis.
3. Moderate plantar calcaneal heel spur.
